# Patient Record
Sex: Male | Born: 1970 | Race: White | Hispanic: No | Smoking: Current every day smoker
Health system: Southern US, Community
[De-identification: ages and names within clinical notes are randomized; demographics above are authoritative.]

## PROBLEM LIST (undated history)

## (undated) DIAGNOSIS — N200 Calculus of kidney: Secondary | ICD-10-CM

## (undated) DIAGNOSIS — F1910 Other psychoactive substance abuse, uncomplicated: Secondary | ICD-10-CM

## (undated) HISTORY — PX: WRIST SURGERY: SHX841

---

## 2006-02-09 ENCOUNTER — Emergency Department: Payer: Self-pay | Admitting: Emergency Medicine

## 2007-09-15 ENCOUNTER — Emergency Department: Payer: Self-pay | Admitting: Emergency Medicine

## 2007-12-14 ENCOUNTER — Emergency Department: Payer: Self-pay | Admitting: Emergency Medicine

## 2008-03-09 ENCOUNTER — Emergency Department: Payer: Self-pay | Admitting: Emergency Medicine

## 2010-11-23 ENCOUNTER — Emergency Department: Payer: Self-pay | Admitting: Emergency Medicine

## 2011-02-25 ENCOUNTER — Emergency Department: Payer: Self-pay | Admitting: Emergency Medicine

## 2011-02-26 ENCOUNTER — Emergency Department: Payer: Self-pay | Admitting: Emergency Medicine

## 2011-04-07 ENCOUNTER — Emergency Department: Payer: Self-pay | Admitting: Internal Medicine

## 2011-07-04 ENCOUNTER — Emergency Department (HOSPITAL_COMMUNITY)
Admission: EM | Admit: 2011-07-04 | Discharge: 2011-07-04 | Disposition: A | Discharge status: Home / Self Care | Payer: Self-pay | Attending: Emergency Medicine | Admitting: Emergency Medicine

## 2011-07-04 ENCOUNTER — Encounter: Payer: Self-pay | Admitting: *Deleted

## 2011-07-04 DIAGNOSIS — F17200 Nicotine dependence, unspecified, uncomplicated: Secondary | ICD-10-CM | POA: Insufficient documentation

## 2011-07-04 DIAGNOSIS — F1120 Opioid dependence, uncomplicated: Secondary | ICD-10-CM | POA: Insufficient documentation

## 2011-07-04 LAB — CBC
MCV: 89.6 fL (ref 78.0–100.0)
Platelets: 280 10*3/uL (ref 150–400)
RBC: 4.83 MIL/uL (ref 4.22–5.81)
RDW: 13.8 % (ref 11.5–15.5)
WBC: 9 10*3/uL (ref 4.0–10.5)

## 2011-07-04 LAB — RAPID URINE DRUG SCREEN, HOSP PERFORMED
Amphetamines: NOT DETECTED
Barbiturates: POSITIVE — AB
Benzodiazepines: NOT DETECTED
Tetrahydrocannabinol: POSITIVE — AB

## 2011-07-04 LAB — URINE MICROSCOPIC-ADD ON

## 2011-07-04 LAB — URINALYSIS, ROUTINE W REFLEX MICROSCOPIC
Bilirubin Urine: NEGATIVE
Glucose, UA: NEGATIVE mg/dL
Ketones, ur: NEGATIVE mg/dL
Protein, ur: NEGATIVE mg/dL
pH: 7 (ref 5.0–8.0)

## 2011-07-04 LAB — COMPREHENSIVE METABOLIC PANEL
ALT: 231 U/L — ABNORMAL HIGH (ref 0–53)
AST: 67 U/L — ABNORMAL HIGH (ref 0–37)
Albumin: 3.7 g/dL (ref 3.5–5.2)
CO2: 26 mEq/L (ref 19–32)
Chloride: 101 mEq/L (ref 96–112)
Creatinine, Ser: 0.86 mg/dL (ref 0.50–1.35)
GFR calc non Af Amer: >90 mL/min (ref >90)
Potassium: 3.9 mEq/L (ref 3.5–5.1)
Sodium: 138 mEq/L (ref 135–145)
Total Bilirubin: 0.3 mg/dL (ref 0.3–1.2)

## 2011-07-04 MED ORDER — ZOLPIDEM TARTRATE 5 MG PO TABS: 5.0000 mg | ORAL_TABLET | Freq: Every evening | ORAL | Status: DC | PRN

## 2011-07-04 MED ORDER — ONDANSETRON HCL 4 MG PO TABS: 4.0000 mg | ORAL_TABLET | Freq: Four times a day (QID) | ORAL | Status: AC

## 2011-07-04 MED ORDER — CLONIDINE HCL 0.2 MG PO TABS: 0.1000 mg | ORAL_TABLET | Freq: Three times a day (TID) | ORAL | Status: DC | PRN

## 2011-07-04 NOTE — ED Notes (Signed)
Patient is alert and oriented x 3.  Denies any SI/HI

## 2011-07-04 NOTE — ED Provider Notes (Signed)
History     CSN: [id number] Arrival date & time: 07/04/2011  5:46 PM   First MD Initiated Contact with Patient 07/04/11 1837      Chief Complaint  Patient presents with  . Addiction Problem    (Consider location/radiation/quality/duration/timing/severity/associated sxs/prior treatment) The history is provided by the patient.   requests detox from her periods and heroin.  He reports using for the last 10 years.  Denies suicidal and homicidal ideation.  His last use was this morning.  He does not abuse other drugs.  Nothing worsens the symptoms.  Nothing improves his symptoms.  He was hoping for something to help him as he goes through withdrawals.  History reviewed. No pertinent past medical history.  History reviewed. No pertinent past surgical history.  History reviewed. No pertinent family history.  History  Substance Use Topics  . Smoking status: Current Everyday Smoker -- 1.0 packs/day    Types: Cigarettes  . Smokeless tobacco: Not on file  . Alcohol Use: Yes      Review of Systems  All other systems reviewed and are negative.    Allergies  Review of patient's allergies indicates no known allergies.  Home Medications   Current Outpatient Rx  Name Route Sig Dispense Refill  . CLONIDINE HCL 0.2 MG PO TABS Oral Take 0.5 tablets (0.1 mg total) by mouth 3 (three) times daily as needed. 15 tablet 0  . ONDANSETRON HCL 4 MG PO TABS Oral Take 1 tablet (4 mg total) by mouth every 6 (six) hours. 12 tablet 0  . ZOLPIDEM TARTRATE 5 MG PO TABS Oral Take 1 tablet (5 mg total) by mouth at bedtime as needed for sleep. 7 tablet 0    BP 113/63  Pulse 73  Temp(Src) 98.6 F (37 C) (Oral)  Resp 16  SpO2 99%  Physical Exam  Constitutional: He is oriented to person, place, and time. He appears well-developed and well-nourished.  HENT:  Head: Normocephalic.  Eyes: EOM are normal.  Neck: Normal range of motion.  Pulmonary/Chest: Effort normal.  Musculoskeletal: Normal range  of motion.  Neurological: He is alert and oriented to person, place, and time.  Psychiatric: He has a normal mood and affect.    ED Course  Procedures (including critical care time)   Labs Reviewed  URINALYSIS, ROUTINE W REFLEX MICROSCOPIC  URINE RAPID DRUG SCREEN (HOSP PERFORMED)  CBC  COMPREHENSIVE METABOLIC PANEL  ETHANOL   No results found.   1. Narcotic addiction       MDM  Outpatient narcotic withdrawal medicines given.  Instructed to return to ER for new or worsening symptoms including severe nausea vomiting and inability to keep fluids down.  No indication for involuntary commitment or inpatient treatment.  Patient is agreeable to outpatient plan       Lyanne Co, MD 07/04/11 [phone]

## 2011-07-04 NOTE — ED Notes (Signed)
Patient is requesting detox from opiods and heroin. Patient has been using for the past 10 years but the last few years it has been worse due to his wife passing away.  Patient last use was this am with heroin

## 2011-07-21 ENCOUNTER — Emergency Department: Payer: Self-pay | Admitting: *Deleted

## 2011-07-28 ENCOUNTER — Emergency Department: Payer: Self-pay | Admitting: Emergency Medicine

## 2014-07-04 ENCOUNTER — Emergency Department: Payer: Self-pay | Admitting: Emergency Medicine

## 2014-08-26 ENCOUNTER — Emergency Department: Payer: Self-pay | Admitting: Emergency Medicine

## 2014-12-04 ENCOUNTER — Observation Stay
Admit: 2014-12-04 | Disposition: A | Discharge status: Home / Self Care | Payer: Self-pay | Attending: Internal Medicine | Admitting: Internal Medicine

## 2014-12-04 LAB — BASIC METABOLIC PANEL
ANION GAP: 3 — AB (ref 7–16)
BUN: 15 mg/dL
CHLORIDE: 105 mmol/L
Calcium, Total: 9.1 mg/dL
Co2: 25 mmol/L
Creatinine: 0.73 mg/dL
EGFR (Non-African Amer.): >60
GLUCOSE: 98 mg/dL
POTASSIUM: 4.1 mmol/L
SODIUM: 133 mmol/L — AB

## 2014-12-04 LAB — HEPATIC FUNCTION PANEL A (ARMC)
ALBUMIN: 4.2 g/dL
Alkaline Phosphatase: 64 U/L
BILIRUBIN DIRECT: 0.1 mg/dL
BILIRUBIN INDIRECT: 0.6
BILIRUBIN TOTAL: 0.7 mg/dL
SGOT(AST): 31 U/L
SGPT (ALT): 54 U/L
TOTAL PROTEIN: 7.7 g/dL

## 2014-12-04 LAB — CBC
HCT: 43.7 % (ref 40.0–52.0)
HGB: 14.6 g/dL (ref 13.0–18.0)
MCH: 29.2 pg (ref 26.0–34.0)
MCHC: 33.3 g/dL (ref 32.0–36.0)
MCV: 88 fL (ref 80–100)
PLATELETS: 282 10*3/uL (ref 150–440)
RBC: 4.99 10*6/uL (ref 4.40–5.90)
RDW: 14 % (ref 11.5–14.5)
WBC: 9.6 10*3/uL (ref 3.8–10.6)

## 2014-12-04 LAB — TROPONIN I
TROPONIN-I: 0.06 ng/mL — AB
TROPONIN-I: 0.04 ng/mL — AB

## 2014-12-04 LAB — CK TOTAL AND CKMB (NOT AT ARMC)
CK, TOTAL: 190 U/L
CK-MB: 3.1 ng/mL

## 2014-12-05 LAB — DRUG SCREEN, URINE
AMPHETAMINES, UR SCREEN: NEGATIVE
BENZODIAZEPINE, UR SCRN: POSITIVE
Barbiturates, Ur Screen: NEGATIVE
Cannabinoid 50 Ng, Ur ~~LOC~~: NEGATIVE
Cocaine Metabolite,Ur ~~LOC~~: POSITIVE
MDMA (ECSTASY) UR SCREEN: NEGATIVE
METHADONE, UR SCREEN: NEGATIVE
OPIATE, UR SCREEN: POSITIVE
Phencyclidine (PCP) Ur S: NEGATIVE
TRICYCLIC, UR SCREEN: NEGATIVE

## 2014-12-05 LAB — BASIC METABOLIC PANEL
Anion Gap: 2 — ABNORMAL LOW (ref 7–16)
BUN: 14 mg/dL
CHLORIDE: 107 mmol/L
Calcium, Total: 8.5 mg/dL — ABNORMAL LOW
Co2: 25 mmol/L
Creatinine: 0.78 mg/dL
EGFR (African American): >60
EGFR (Non-African Amer.): >60
Glucose: 87 mg/dL
POTASSIUM: 4.8 mmol/L
SODIUM: 134 mmol/L — AB

## 2014-12-05 LAB — TROPONIN I: Troponin-I: 0.06 ng/mL — ABNORMAL HIGH

## 2014-12-05 LAB — CBC WITH DIFFERENTIAL/PLATELET
BASOS ABS: 0.1 10*3/uL (ref 0.0–0.1)
Basophil %: 0.7 %
EOS ABS: 0.2 10*3/uL (ref 0.0–0.7)
Eosinophil %: 3 %
HCT: 44.3 % (ref 40.0–52.0)
HGB: 15.1 g/dL (ref 13.0–18.0)
LYMPHS ABS: 1.8 10*3/uL (ref 1.0–3.6)
Lymphocyte %: 24.4 %
MCH: 29.9 pg (ref 26.0–34.0)
MCHC: 34 g/dL (ref 32.0–36.0)
MCV: 88 fL (ref 80–100)
MONOS PCT: 10.8 %
Monocyte #: 0.8 x10 3/mm (ref 0.2–1.0)
Neutrophil #: 4.6 10*3/uL (ref 1.4–6.5)
Neutrophil %: 61.1 %
PLATELETS: 262 10*3/uL (ref 150–440)
RBC: 5.04 10*6/uL (ref 4.40–5.90)
RDW: 14.5 % (ref 11.5–14.5)
WBC: 7.5 10*3/uL (ref 3.8–10.6)

## 2014-12-05 LAB — LIPID PANEL
Cholesterol: 168 mg/dL
HDL Cholesterol: 32 mg/dL — ABNORMAL LOW
Ldl Cholesterol, Calc: 126 mg/dL — ABNORMAL HIGH
Triglycerides: 49 mg/dL
VLDL Cholesterol, Calc: 10 mg/dL

## 2014-12-05 LAB — TSH: THYROID STIMULATING HORM: 0.464 u[IU]/mL

## 2014-12-05 LAB — CK TOTAL AND CKMB (NOT AT ARMC)
CK, TOTAL: 188 U/L
CK-MB: 2.8 ng/mL

## 2014-12-05 LAB — HEMOGLOBIN A1C
HEMOGLOBIN A1C: 5.2 %
Hemoglobin A1C: 5.2 %

## 2014-12-06 DIAGNOSIS — R079 Chest pain, unspecified: Secondary | ICD-10-CM | POA: Diagnosis not present
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
## 2014-12-16 NOTE — Consult Note (Signed)
Psychiatry: PAtient seen. He is having significant discomfort tonight from opiate withdrawl. Feeling shaky and having GI symptoms. Mood mildly down and anxious. give one time dose of subutex 16mg  tonight to help with withdrawl. Supportive encouragement. No other change to current treatment.   Electronic Signatures: Clapacs, Jackquline DenmarkJohn T (MD)  (Signed on 20-Apr-16 23:31)  Authored  Last Updated: 20-Apr-16 23:31 by Audery Amellapacs, John T (MD)
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
## 2014-12-16 NOTE — Discharge Summary (Addendum)
PATIENT NAME:  Barry Wheeler, Barry Wheeler MR#:  [id number] DATE OF BIRTH:  October 16, 1970  DATE OF ADMISSION:  12/04/2014 DATE OF DISCHARGE:  12/06/2014  PRIMARY DOCTOR:   Nonlocal.   DISCHARGE DIAGNOSES: 1. Chest pain secondary to anxiety.  2. History of heroin addiction.   3. Narcotic abuse.  4. Heroin withdrawal symptoms.   DISCHARGE MEDICATIONS: Atorvastatin 20 mg p.o. at bedtime.   DIET:  Low fat diet.   CONSULTATIONS:  Psychiatric consult with Mordecai RasmussenJohn Clapacs, MD   HOSPITAL COURSE: A [age] year old male patient with significant history of heroin addiction comes in because of chest pain. He also had nausea and diaphoresis when he came.  Chest pain is of 2 days' duration.  No aggravating or relieving factors.    1. The patient admitted to chest pain rule out and and started on aspirin, nitroglycerin, and statins. The patient's troponins were negative x3 with normal CK and CPK-MB.  The patient had a Lexiscan stress test done on April 21 which showed EF of 55% with left ventricular function.  No EKG changes concerning for ischemia and no wall motion abnormalities and  EF more than 55%  The patient's LDL more than 100, we discharged him with statins.  2. Heroin addiction.  The patient is addicted to heroin about 1 to 2 times per day IV and it is going on for about 5 years.  The patient was seen by Dr. Toni Amendlapacs and we asked for a consult for possible opiate withdrawal syndrome.   The patient's last use was around 1:00 a.m. on the day of admission. He was given Suboxone before this admission previously, but Dr. Toni Amendlapacs suggested Suboxone should be given 8 mg every 8 hours as needed for withdrawal symptoms. The patient also was given p.r.n. suboxone..  We asked  for  social work evaluation about substance abuse treatment facilities.   The patient received Suboxone for opiate withdrawal symptoms of nausea,  vomiting, and sweating and discharged home,  and he is advised to followup with substance abuse treatment at  [facility]   We gave information about [facility],   DISCHARGE VITAL SIGNS: Temperature 98.9, heart rate 54, blood pressure 120/80 saturation 96% on room air.   PHYSICAL EXAMINATION:  alert.oriented. LUNGS: Clear to auscultation. No wheeze.  No rales.  ABDOMEN: Soft, nontender, nondistended, no rebound.  EXTREmities;no edema,no cyanosis,no clubbing  TIME SPENT: More than 50 minutes.    ____________________________ Katha HammingSnehalatha Resa Rinks, MD sk:tr D: 12/08/2014 06:54:55 ET T: 12/08/2014 12:14:14 ET JOB#: [id number]  cc: Katha HammingSnehalatha Kimbery Harwood, MD, <Dictator> Katha HammingSNEHALATHA Cambry Spampinato MD ELECTRONICALLY SIGNED 12/18/2014 21:23

## 2014-12-18 ENCOUNTER — Encounter: Payer: Self-pay | Admitting: Cardiovascular Disease

## 2015-02-03 ENCOUNTER — Emergency Department
Admission: EM | Admit: 2015-02-03 | Discharge: 2015-02-04 | Disposition: A | Discharge status: Home / Self Care | Payer: Self-pay | Attending: Emergency Medicine | Admitting: Emergency Medicine

## 2015-02-03 ENCOUNTER — Encounter: Payer: Self-pay | Admitting: Emergency Medicine

## 2015-02-03 DIAGNOSIS — Z720 Tobacco use: Secondary | ICD-10-CM | POA: Insufficient documentation

## 2015-02-03 DIAGNOSIS — K1120 Sialoadenitis, unspecified: Secondary | ICD-10-CM | POA: Insufficient documentation

## 2015-02-03 MED ORDER — OXYCODONE-ACETAMINOPHEN 5-325 MG PO TABS
1.0000 | ORAL_TABLET | Freq: Once | ORAL | Status: AC
  Administered 2015-02-04: 1 via ORAL

## 2015-02-03 NOTE — ED Notes (Addendum)
Pt presents to ER alert and in NAD. pt has swelling noted to left side of jaw, denies toothache.

## 2015-02-03 NOTE — ED Provider Notes (Signed)
[facility] Emergency Department Provider Note  ____________________________________________  Time seen: Approximately 11:20 PM  I have reviewed the triage vital signs and the nursing notes.   HISTORY  Chief Complaint Jaw Pain    HPI Barry Wheeler is a 44 y.o. male who presents with 2 days of left-sided jaw swelling. He reports that initially it was tender but the swelling seemed to really increased today. The patient reports that he has tried ice and heat as well as ibuprofen and the pain has not improved. The patient reports that he's never had this before. He reports it is a sharp pain in his left face it is a 9 out of 10 in intensity. He reports he also has some pain when he moves his neck back and forth. The patient also denies any trauma to the area. He's had no difficulty swallowing, no fevers no headache or blurry vision.   History reviewed. No pertinent past medical history.  There are no active problems to display for this patient.   Past Surgical History  Procedure Laterality Date  . Wrist surgery      Current Outpatient Rx  Name  Route  Sig  Dispense  Refill  . clindamycin (CLEOCIN) 300 MG capsule   Oral   Take 1 capsule (300 mg total) by mouth 3 (three) times daily.   30 capsule   0   . EXPIRED: cloNIDine (CATAPRES) 0.2 MG tablet   Oral   Take 0.5 tablets (0.1 mg total) by mouth 3 (three) times daily as needed.   15 tablet   0   . traMADol (ULTRAM) 50 MG tablet   Oral   Take 1 tablet (50 mg total) by mouth every 6 (six) hours as needed.   12 tablet   0   . EXPIRED: zolpidem (AMBIEN) 5 MG tablet   Oral   Take 1 tablet (5 mg total) by mouth at bedtime as needed for sleep.   7 tablet   0     Allergies Review of patient's allergies indicates no known allergies.  History reviewed. No pertinent family history.  Social History History  Substance Use Topics  . Smoking status: Current Every Day Smoker -- 1.00 packs/day   Types: Cigarettes  . Smokeless tobacco: Not on file  . Alcohol Use: Yes    Review of Systems Constitutional: No fever/chills Eyes: No visual changes. ENT: Left facial swelling Cardiovascular: Denies chest pain. Respiratory: Denies shortness of breath. Gastrointestinal: No abdominal pain.  No nausea, no vomiting.   Genitourinary: Negative for dysuria. Musculoskeletal: Negative for back pain. Skin: Negative for rash. Neurological: Negative for headaches  10-point ROS otherwise negative.  ____________________________________________   PHYSICAL EXAM:  VITAL SIGNS: ED Triage Vitals  Enc Vitals Group     BP 02/03/15 2108 130/87 mmHg     Pulse Rate 02/03/15 2108 86     Resp 02/03/15 2108 20     Temp 02/03/15 2108 98.4 F (36.9 C)     Temp Source 02/03/15 2108 Oral     SpO2 02/03/15 2108 100 %     Weight 02/03/15 2108 155 lb (70.308 kg)     Height 02/03/15 2108 5\' 6"  (1.676 m)     Head Cir --      Peak Flow --      Pain Score 02/03/15 2108 9     Pain Loc --      Pain Edu? --      Excl. in GC? --  Constitutional: Alert and oriented. Well appearing and in moderate distress. Eyes: Conjunctivae are normal. PERRL. EOMI. Head: Atraumatic. Swelling and tenderness to palpation that is firm along the left angle of the mandible going down into the patient's neck Nose: No congestion/rhinnorhea. Mouth/Throat: Mucous membranes are moist.  Oropharynx non-erythematous. Neck: No stridor.  Hematological/Lymphatic/Immunilogical: No cervical lymphadenopathy. Cardiovascular: Normal rate, regular rhythm. Grossly normal heart sounds.  Good peripheral circulation. Respiratory: Normal respiratory effort.  No retractions. Lungs CTAB. Gastrointestinal: Soft and nontender. No distention. Positive bowel sounds Genitourinary: Deferred Musculoskeletal: No lower extremity tenderness nor edema.   Neurologic:  Normal speech and language. No gross focal neurologic deficits are appreciated.  Skin:   Skin is warm, dry and intact.  Psychiatric: Mood and affect are normal.   ____________________________________________   LABS (all labs ordered are listed, but only abnormal results are displayed)  Labs Reviewed  BASIC METABOLIC PANEL - Abnormal; Notable for the following:    Glucose, Bld 100 (*)    Calcium 8.5 (*)    Anion gap 4 (*)    All other components within normal limits  CBC WITH DIFFERENTIAL/PLATELET - Abnormal; Notable for the following:    WBC 11.6 (*)    Neutro Abs 7.6 (*)    Monocytes Absolute 1.4 (*)    All other components within normal limits   ____________________________________________  EKG  None ____________________________________________  RADIOLOGY  CT soft tissue neck: Subtle hyper enhancement within the superficial lobe of the left parotid gland with associated mild inflammatory stranding within the adjacent subcutaneous fat, most consistent with acute parotitis ____________________________________________   PROCEDURES  Procedure(s) performed: None  Critical Care performed: No  ____________________________________________   INITIAL IMPRESSION / ASSESSMENT AND PLAN / ED COURSE  Pertinent labs & imaging results that were available during my care of the patient were reviewed by me and considered in my medical decision making (see chart for details).  This is a 44 year old male who comes in today with left-sided swelling of his face. I will give the patient a dose of Percocet and do a CT of his jaw and neck looking for the cause of this mass whether it is an enlarged lymph node or a tumor in one of his glands. The patient will receive some blood work as well  ----------------------------------------- 3:38 AM on 02/04/2015 -----------------------------------------  The patient did not have any constitutional symptoms with his parotitis nor does he have any drainage from his parotid duct. I will give the patient a dose of clindamycin IV 1 and  then the patient home with clindamycin to treat his parotitis. Patient currently appears well. He has mild leukocytosis. I will have the patient follow-up with ENT for further evaluation and advise him to return should he develop any high fevers any difficulty swallowing or worsening symptoms. ____________________________________________   FINAL CLINICAL IMPRESSION(S) / ED DIAGNOSES  Final diagnoses:  Parotitis      Rebecka Apley, MD 02/04/15 [phone]

## 2015-02-03 NOTE — ED Notes (Signed)
Report given to Myrtha Mantis, RN

## 2015-02-04 ENCOUNTER — Emergency Department: Payer: PRIVATE HEALTH INSURANCE

## 2015-02-04 ENCOUNTER — Encounter: Payer: Self-pay | Admitting: Radiology

## 2015-02-04 LAB — CBC WITH DIFFERENTIAL/PLATELET
BASOS ABS: 0.1 10*3/uL (ref 0–0.1)
Basophils Relative: 1 %
EOS PCT: 2 %
Eosinophils Absolute: 0.2 10*3/uL (ref 0–0.7)
HCT: 42.6 % (ref 40.0–52.0)
Hemoglobin: 14.4 g/dL (ref 13.0–18.0)
LYMPHS PCT: 21 %
Lymphs Abs: 2.4 10*3/uL (ref 1.0–3.6)
MCH: 29.8 pg (ref 26.0–34.0)
MCHC: 33.9 g/dL (ref 32.0–36.0)
MCV: 88.1 fL (ref 80.0–100.0)
Monocytes Absolute: 1.4 10*3/uL — ABNORMAL HIGH (ref 0.2–1.0)
Monocytes Relative: 12 %
NEUTROS ABS: 7.6 10*3/uL — AB (ref 1.4–6.5)
NEUTROS PCT: 64 %
Platelets: 339 10*3/uL (ref 150–440)
RBC: 4.83 MIL/uL (ref 4.40–5.90)
RDW: 14.2 % (ref 11.5–14.5)
WBC: 11.6 10*3/uL — AB (ref 3.8–10.6)

## 2015-02-04 LAB — BASIC METABOLIC PANEL
Anion gap: 4 — ABNORMAL LOW (ref 5–15)
BUN: 14 mg/dL (ref 6–20)
CALCIUM: 8.5 mg/dL — AB (ref 8.9–10.3)
CHLORIDE: 109 mmol/L (ref 101–111)
CO2: 23 mmol/L (ref 22–32)
CREATININE: 0.86 mg/dL (ref 0.61–1.24)
GFR calc Af Amer: >60 mL/min (ref >60)
GFR calc non Af Amer: >60 mL/min (ref >60)
Glucose, Bld: 100 mg/dL — ABNORMAL HIGH (ref 65–99)
Potassium: 3.5 mmol/L (ref 3.5–5.1)
Sodium: 136 mmol/L (ref 135–145)

## 2015-02-04 MED ORDER — OXYCODONE-ACETAMINOPHEN 5-325 MG PO TABS
ORAL_TABLET | ORAL | Status: AC
  Administered 2015-02-04: 1 via ORAL
  Filled 2015-02-04: qty 1

## 2015-02-04 MED ORDER — CLINDAMYCIN PHOSPHATE 600 MG/50ML IV SOLN
INTRAVENOUS | Status: AC
  Filled 2015-02-04: qty 50

## 2015-02-04 MED ORDER — TRAMADOL HCL 50 MG PO TABS: 50.0000 mg | ORAL_TABLET | Freq: Four times a day (QID) | ORAL | Status: DC | PRN

## 2015-02-04 MED ORDER — CLINDAMYCIN PHOSPHATE 600 MG/50ML IV SOLN
600.0000 mg | Freq: Once | INTRAVENOUS | Status: AC
  Administered 2015-02-04: 600 mg via INTRAVENOUS

## 2015-02-04 MED ORDER — IOHEXOL 300 MG/ML  SOLN
75.0000 mL | Freq: Once | INTRAMUSCULAR | Status: AC | PRN
  Administered 2015-02-04: 75 mL via INTRAVENOUS

## 2015-02-04 MED ORDER — CLINDAMYCIN HCL 300 MG PO CAPS: 300.0000 mg | ORAL_CAPSULE | Freq: Three times a day (TID) | ORAL | Status: DC

## 2015-02-04 NOTE — ED Notes (Signed)
Pt became very angry when he saw his prescription for Tramadol; began cursing; left hastily from treatment room; mother stayed long enough to get prescription info and followup information

## 2015-02-04 NOTE — Discharge Instructions (Signed)
Parotitis °Parotitis is soreness and inflammation of one or both parotid glands. The parotid glands produce saliva. They are located on each side of the face, below and in front of the earlobes. The saliva produced comes out of tiny openings (ducts) inside the cheeks. In most cases, parotitis goes away over time or with treatment. If your parotitis is caused by certain long-term (chronic) diseases, it may come back again.  °CAUSES  °Parotitis can be caused by: °· Viral infections. Mumps is one viral infection that can cause parotitis. °· Bacterial infections. °· Blockage of the salivary ducts due to a salivary stone. °· Narrowing of the salivary ducts. °· Swelling of the salivary ducts. °· Dehydration. °· Autoimmune conditions, such as sarcoidosis or Sjogren syndrome. °· Air from activities such as scuba diving, glass blowing, or playing an instrument (rare). °· Human immunodeficiency virus (HIV) or acquired immunodeficiency syndrome (AIDS). °· Tuberculosis. °SIGNS AND SYMPTOMS  °· The ears may appear to be pushed up and out from their normal position. °· Redness (erythema) of the skin over the parotid glands. °· Pain and tenderness over the parotid glands. °· Swelling in the parotid gland area. °· Yellowish-white fluid (pus) coming from the ducts inside the cheeks. °· Dry mouth. °· Bad taste in the mouth. °DIAGNOSIS  °Your health care provider may determine that you have parotitis based on your symptoms and a physical exam. A sample of fluid may also be taken from the parotid gland and tested to find the cause of your infection. X-rays or computed tomography (CT) scans may be taken if your health care provider thinks you might have a salivary stone blocking your salivary duct. °TREATMENT  °Treatment varies depending upon the cause of your parotitis. If your parotitis is caused by mumps, no treatment is needed. The condition will go away on its own after 7 to 10 days. In other cases, treatment may  include: °· Antibiotic medicine if your infection was caused by bacteria. °· Pain medicines. °· Gland massage. °· Eating sour candy to increase your saliva production. °· Removal of salivary stones. Your health care provider may flush stones out with fluids or remove them with tweezers. °· Surgery to remove the parotid glands. °HOME CARE INSTRUCTIONS  °· If you were prescribed an antibiotic medicine, finish it all even if you start to feel better. °· Put warm compresses on the sore area. °· Take medicines only as directed by your health care provider. °· Drink enough fluids to keep your urine clear or pale yellow. °SEEK IMMEDIATE MEDICAL CARE IF:  °· You have increasing pain or swelling that is not controlled with medicine. °· You have a fever. °MAKE SURE YOU: °· Understand these instructions. °· Will watch your condition. °· Will get help right away if you are not doing well or get worse. °Document Released: 01/23/2002 Document Revised: 12/18/2013 Document Reviewed: 06/29/2011 °[employer]® Patient Information ©2015 [employer]. This information is not intended to replace advice given to you by your health care provider. Make sure you discuss any questions you have with your health care provider. ° °

## 2015-02-05 ENCOUNTER — Emergency Department (HOSPITAL_COMMUNITY)
Admission: EM | Admit: 2015-02-05 | Discharge: 2015-02-05 | Disposition: A | Discharge status: Home / Self Care | Payer: PRIVATE HEALTH INSURANCE | Attending: Emergency Medicine | Admitting: Emergency Medicine

## 2015-02-05 ENCOUNTER — Encounter (HOSPITAL_COMMUNITY): Payer: Self-pay | Admitting: Emergency Medicine

## 2015-02-05 DIAGNOSIS — R6883 Chills (without fever): Secondary | ICD-10-CM | POA: Insufficient documentation

## 2015-02-05 DIAGNOSIS — Z720 Tobacco use: Secondary | ICD-10-CM | POA: Insufficient documentation

## 2015-02-05 DIAGNOSIS — K1121 Acute sialoadenitis: Secondary | ICD-10-CM | POA: Insufficient documentation

## 2015-02-05 DIAGNOSIS — Z792 Long term (current) use of antibiotics: Secondary | ICD-10-CM | POA: Insufficient documentation

## 2015-02-05 MED ORDER — HYDROCODONE-ACETAMINOPHEN 5-325 MG PO TABS: 1.0000 | ORAL_TABLET | ORAL | Status: DC | PRN

## 2015-02-05 MED ORDER — IBUPROFEN 800 MG PO TABS: 800.0000 mg | ORAL_TABLET | Freq: Three times a day (TID) | ORAL | Status: DC | PRN

## 2015-02-05 MED ORDER — HYDROCODONE-ACETAMINOPHEN 5-325 MG PO TABS
1.0000 | ORAL_TABLET | Freq: Once | ORAL | Status: AC
  Administered 2015-02-05: 1 via ORAL
  Filled 2015-02-05: qty 1

## 2015-02-05 NOTE — ED Notes (Signed)
Pt seen 2 days ago at [facility] for same with swelling from tear duct to left side of face; pt sts taking pain meds but not helping

## 2015-02-05 NOTE — ED Provider Notes (Signed)
CSN: [id number]     Arrival date & time 02/05/15  1715 History  This chart was scribed for Trixie Dredge, PA-C working with Gerhard Munch, MD by Evon Slack, ED Scribe. This patient was seen in room TR04C/TR04C and the patient's care was started at 6:08 PM.    Chief Complaint  Patient presents with  . Facial Pain   The history is provided by the patient. No language interpreter was used.   HPI Comments: Barry Wheeler is a 44 y.o. male who presents to the Emergency Department complaining of worsening left sided facial pain and swelling that began 5 days ago. Pt was seen yesterday at [facility] and had CT of jaw and neck that showed findings consistent with acute parotitis. He was discharged home on clindamycin and tramadol. Pt states that he is compliant with taking the medications prescribed. Pt state that the tramadol prescribed is not providing any relief. Pt states that initially the area was tender and he noticed swelling a few days later. Pt states that he has a follow up appointment with ENT on Monday. Pt does report intermittent chills. He denies fever, sore throat, trouble swallowing, dental pain, SOB, CP, nausea or vomiting.  Denies any significant change in his symptoms, he came to the ED today for uncontrolled pain.     History reviewed. No pertinent past medical history. Past Surgical History  Procedure Laterality Date  . Wrist surgery     History reviewed. No pertinent family history. History  Substance Use Topics  . Smoking status: Current Every Day Smoker -- 1.00 packs/day    Types: Cigarettes  . Smokeless tobacco: Not on file  . Alcohol Use: Yes    Review of Systems  Constitutional: Positive for chills. Negative for fever.  HENT: Positive for facial swelling. Negative for dental problem, sore throat and trouble swallowing.   Respiratory: Negative for shortness of breath.   Cardiovascular: Negative for chest pain.  Gastrointestinal: Negative for  nausea and vomiting.  Musculoskeletal: Negative for neck pain and neck stiffness.  Skin: Negative for rash.  Allergic/Immunologic: Negative for immunocompromised state.  Psychiatric/Behavioral: Negative for self-injury.    Allergies  Review of patient's allergies indicates no known allergies.  Home Medications   Prior to Admission medications   Medication Sig Start Date End Date Taking? Authorizing Provider  clindamycin (CLEOCIN) 300 MG capsule Take 1 capsule (300 mg total) by mouth 3 (three) times daily. 02/04/15   Rebecka Apley, MD  cloNIDine (CATAPRES) 0.2 MG tablet Take 0.5 tablets (0.1 mg total) by mouth 3 (three) times daily as needed. 07/04/11 07/03/12  Azalia Bilis, MD  traMADol (ULTRAM) 50 MG tablet Take 1 tablet (50 mg total) by mouth every 6 (six) hours as needed. 02/04/15   Rebecka Apley, MD  zolpidem (AMBIEN) 5 MG tablet Take 1 tablet (5 mg total) by mouth at bedtime as needed for sleep. 07/04/11 07/03/12  Azalia Bilis, MD   BP 115/71 mmHg  Pulse 82  Temp(Src) 97.6 F (36.4 C) (Oral)  Resp 16  Ht  (1.676 m)  Wt 148 lb 14.4 oz (67.541 kg)  BMI 24.04 kg/m2  SpO2 98%   Physical Exam  Constitutional: He appears well-developed and well-nourished. No distress.  HENT:  Head: Normocephalic and atraumatic.  Mouth/Throat: Oropharynx is clear and moist.  Firm tender swelling at angle of mandible on left with no erythema, warmth, edema, fluctuance. Severe dental decay of bilateral lower molars without apparent abscess- no erythema, edema, discharge  associated.  Neck: Neck supple.  Cardiovascular: Normal rate and normal heart sounds.   No murmur heard. Pulmonary/Chest: Effort normal. No respiratory distress.  Neurological: He is alert.  Skin: He is not diaphoretic.  Nursing note and vitals reviewed.   ED Course  Procedures (including critical care time) DIAGNOSTIC STUDIES: Oxygen Saturation is 98% on RA, normal by my interpretation.    COORDINATION OF  CARE: 6:26 PM-Discussed treatment plan with pt at bedside and pt agreed to plan.     Labs Review Labs Reviewed - No data to display  Imaging Review Ct Soft Tissue Neck W Contrast  02/04/2015   CLINICAL DATA:  Initial evaluation for 2 day history of left-sided jaw swelling, pain.  EXAM: CT NECK WITH CONTRAST  TECHNIQUE: Multidetector CT imaging of the neck was performed using the standard protocol following the bolus administration of intravenous contrast.  CONTRAST:  75mL OMNIPAQUE IOHEXOL 300 MG/ML  SOLN  COMPARISON:  None available.  FINDINGS: Visualized portions of the brain are unremarkable. The partially visualized globes and orbits within normal limits. Remote posttraumatic deformity noted at the left lamina papyracea. Mild polypoid opacity present within the inferior right maxillary sinus. The visualized paranasal sinuses are otherwise clear. Right concha bullosa noted. Visualized mastoid air cells are clear.  The right parotid gland and submandibular glands are normal in appearance.  There is subtle hyper attenuation of the superficial lobe of the left parotid gland in the region of the left parotid tail (series 2, image 32). There is subtle inflammatory stranding within the adjacent subcutaneous fat. Finding is most consistent with a focal parotitis. This appears to correspond with the palpable area of concern as evidenced by overlying metallic marker BB in this region. No abscess or drainable fluid collection. No obstructive stone identified.  Oral cavity within normal limits. Palatine tonsils are normal. Parapharyngeal fat preserved. Oropharynx and nasopharynx within normal limits. No retropharyngeal fluid collection. Epiglottis is normal. Vallecula is clear. Hypopharynx and supraglottic larynx demonstrate no acute abnormality. True vocal cords are symmetric and normal in appearance. Subglottic airway is clear.  No pathologically enlarged lymph nodes identified within the neck.  Thyroid gland is  normal.  Visualized superior mediastinum within normal limits.  Mild paraseptal emphysematous changes noted within the visualized right lung. Visualized lungs are otherwise clear.  Normal intravascular enhancement seen within the neck. Mild atheromatous plaque present about the carotid bifurcations without significant stenosis.  No acute osseous abnormality. No worrisome lytic or blastic osseous lesion.  IMPRESSION: 1. Subtle hyper enhancement within the superficial lobe of the left parotid gland with associated mild inflammatory stranding within the adjacent subcutaneous fat. Finding is most consistent with acute parotitis. This appears to correlate with the palpable area of concern. 2. Otherwise normal neck CT.   Electronically Signed   By: Rise Mu M.D.   On: 02/04/2015 02:31     EKG Interpretation None      MDM   Final diagnoses:  Acute parotitis    Afebrile, nontoxic patient seen in ED yesterday found to have acute parotitis, d/c home with clindamycin and tramadol not having pain relief.  No change in symptoms.  No airway concerns or fever.   D/C home with norco, ibuprofen.  Encouraged ENT follow up as planned.  Discussed result, findings, treatment, and follow up  with patient.  Pt given return precautions.  Pt verbalizes understanding and agrees with plan.       I personally performed the services described in this documentation, which was  scribed in my presence. The recorded information has been reviewed and is accurate.      Trixie Dredge, PA-C 02/05/15 2018  Gerhard Munch, MD 02/06/15 [phone]

## 2015-02-05 NOTE — Discharge Instructions (Signed)
Read the information below.  Use the prescribed medication as directed.  Please discuss all new medications with your pharmacist.  Do not take additional tylenol while taking the prescribed pain medication to avoid overdose.  You may return to the Emergency Department at any time for worsening condition or any new symptoms that concern you.   If you develop high fevers, difficulty swallowing or breathing, or you are unable to tolerate fluids by mouth, return to the ER immediately for a recheck.     Parotitis  Parotitis means one or both of your parotid glands are sore and puffy (inflamed). The parotid glands make spit (saliva) in the mouth. HOME CARE  If you were given antibiotic medicines, take them as told. Finish them even if you start to feel better.  Put warm cloths (compresses) on the sore area.  Only take medicines as told by your doctor.  Drink enough fluids to keep your pee (urine) clear or pale yellow. GET HELP RIGHT AWAY IF:  You have more pain or puffiness (swelling) that is not helped by medicine.  You have a fever. MAKE SURE YOU:  Understand these instructions.  Will watch your condition.  Will get help right away if you are not doing well or get worse. Document Released: 09/05/2010 Document Revised: 10/26/2011 Document Reviewed: 06/29/2011 [facility] Patient Information 2015 [place], [place]. This information is not intended to replace advice given to you by your health care provider. Make sure you discuss any questions you have with your health care provider.    Parotitis Parotitis is soreness and inflammation of one or both parotid glands. The parotid glands produce saliva. They are located on each side of the face, below and in front of the earlobes. The saliva produced comes out of tiny openings (ducts) inside the cheeks. In most cases, parotitis goes away over time or with treatment. If your parotitis is caused by certain long-term (chronic) diseases, it may come back  again.  CAUSES  Parotitis can be caused by:  Viral infections. Mumps is one viral infection that can cause parotitis.  Bacterial infections.  Blockage of the salivary ducts due to a salivary stone.  Narrowing of the salivary ducts.  Swelling of the salivary ducts.  Dehydration.  Autoimmune conditions, such as sarcoidosis or Sjogren syndrome.  Air from activities such as scuba diving, glass blowing, or playing an instrument (rare).  Human immunodeficiency virus (HIV) or acquired immunodeficiency syndrome (AIDS).  Tuberculosis. SIGNS AND SYMPTOMS   The ears may appear to be pushed up and out from their normal position.  Redness (erythema) of the skin over the parotid glands.  Pain and tenderness over the parotid glands.  Swelling in the parotid gland area.  Yellowish-white fluid (pus) coming from the ducts inside the cheeks.  Dry mouth.  Bad taste in the mouth. DIAGNOSIS  Your health care provider may determine that you have parotitis based on your symptoms and a physical exam. A sample of fluid may also be taken from the parotid gland and tested to find the cause of your infection. X-rays or computed tomography (CT) scans may be taken if your health care provider thinks you might have a salivary stone blocking your salivary duct. TREATMENT  Treatment varies depending upon the cause of your parotitis. If your parotitis is caused by mumps, no treatment is needed. The condition will go away on its own after 7 to 10 days. In other cases, treatment may include:  Antibiotic medicine if your infection was caused by  bacteria.  Pain medicines.  Gland massage.  Eating sour candy to increase your saliva production.  Removal of salivary stones. Your health care provider may flush stones out with fluids or remove them with tweezers.  Surgery to remove the parotid glands. HOME CARE INSTRUCTIONS   If you were prescribed an antibiotic medicine, finish it all even if you start  to feel better.  Put warm compresses on the sore area.  Take medicines only as directed by your health care provider.  Drink enough fluids to keep your urine clear or pale yellow. SEEK IMMEDIATE MEDICAL CARE IF:   You have increasing pain or swelling that is not controlled with medicine.  You have a fever. MAKE SURE YOU:  Understand these instructions.  Will watch your condition.  Will get help right away if you are not doing well or get worse. Document Released: 01/23/2002 Document Revised: 12/18/2013 Document Reviewed: 06/29/2011 [facility] Patient Information 2015 [place], [place]. This information is not intended to replace advice given to you by your health care provider. Make sure you discuss any questions you have with your health care provider.

## 2016-04-27 ENCOUNTER — Emergency Department
Admission: EM | Admit: 2016-04-27 | Discharge: 2016-04-27 | Disposition: A | Discharge status: Home / Self Care | Payer: PRIVATE HEALTH INSURANCE | Attending: Emergency Medicine | Admitting: Emergency Medicine

## 2016-04-27 ENCOUNTER — Emergency Department: Payer: PRIVATE HEALTH INSURANCE

## 2016-04-27 ENCOUNTER — Encounter: Payer: Self-pay | Admitting: Emergency Medicine

## 2016-04-27 DIAGNOSIS — F419 Anxiety disorder, unspecified: Secondary | ICD-10-CM | POA: Insufficient documentation

## 2016-04-27 DIAGNOSIS — F17210 Nicotine dependence, cigarettes, uncomplicated: Secondary | ICD-10-CM | POA: Insufficient documentation

## 2016-04-27 DIAGNOSIS — R1030 Lower abdominal pain, unspecified: Secondary | ICD-10-CM | POA: Insufficient documentation

## 2016-04-27 DIAGNOSIS — R110 Nausea: Secondary | ICD-10-CM | POA: Insufficient documentation

## 2016-04-27 LAB — URINALYSIS COMPLETE WITH MICROSCOPIC (ARMC ONLY)
BILIRUBIN URINE: NEGATIVE
Bacteria, UA: NONE SEEN
GLUCOSE, UA: NEGATIVE mg/dL
Ketones, ur: NEGATIVE mg/dL
Leukocytes, UA: NEGATIVE
Nitrite: NEGATIVE
Protein, ur: NEGATIVE mg/dL
Specific Gravity, Urine: 1.016 (ref 1.005–1.030)
pH: 5 (ref 5.0–8.0)

## 2016-04-27 LAB — LIPASE, BLOOD: Lipase: 25 U/L (ref 11–51)

## 2016-04-27 LAB — COMPREHENSIVE METABOLIC PANEL
ALBUMIN: 4.1 g/dL (ref 3.5–5.0)
ALT: 70 U/L — AB (ref 17–63)
AST: 39 U/L (ref 15–41)
Alkaline Phosphatase: 52 U/L (ref 38–126)
Anion gap: 5 (ref 5–15)
BILIRUBIN TOTAL: 0.6 mg/dL (ref 0.3–1.2)
BUN: 13 mg/dL (ref 6–20)
CHLORIDE: 103 mmol/L (ref 101–111)
CO2: 28 mmol/L (ref 22–32)
CREATININE: 0.81 mg/dL (ref 0.61–1.24)
Calcium: 9 mg/dL (ref 8.9–10.3)
GFR calc Af Amer: >60 mL/min (ref >60)
GFR calc non Af Amer: >60 mL/min (ref >60)
GLUCOSE: 99 mg/dL (ref 65–99)
POTASSIUM: 3.7 mmol/L (ref 3.5–5.1)
Sodium: 136 mmol/L (ref 135–145)
TOTAL PROTEIN: 7.5 g/dL (ref 6.5–8.1)

## 2016-04-27 LAB — CBC
HCT: 48.1 % (ref 40.0–52.0)
Hemoglobin: 16.7 g/dL (ref 13.0–18.0)
MCH: 31.4 pg (ref 26.0–34.0)
MCHC: 34.7 g/dL (ref 32.0–36.0)
MCV: 90.3 fL (ref 80.0–100.0)
PLATELETS: 222 10*3/uL (ref 150–440)
RBC: 5.32 MIL/uL (ref 4.40–5.90)
RDW: 14.5 % (ref 11.5–14.5)
WBC: 9.6 10*3/uL (ref 3.8–10.6)

## 2016-04-27 MED ORDER — CYCLOBENZAPRINE HCL 10 MG PO TABS: 10.0000 mg | ORAL_TABLET | Freq: Three times a day (TID) | ORAL | 0 refills | Status: DC | PRN

## 2016-04-27 NOTE — ED Provider Notes (Signed)
[facility] Emergency Department Provider Note        Time seen: ----------------------------------------- 9:07 AM on 04/27/2016 -----------------------------------------    I have reviewed the triage vital signs and the nursing notes.   HISTORY  Chief Complaint Abdominal Pain    HPI Barry Wheeler is a 45 y.o. male who presents to ER with abdominal pain for last several months and has been intermittent. He describes occasional nausea and difficulty making urine but denies vomiting or diarrhea. Patient denies fevers or chills, denies chest pain or shortness of breath. Patient states when he gets stressed or anxious and seems to make his symptoms worse.   History reviewed. No pertinent past medical history.  There are no active problems to display for this patient.   Past Surgical History:  Procedure Laterality Date  . WRIST SURGERY      Allergies Review of patient's allergies indicates no known allergies.  Social History Social History  Substance Use Topics  . Smoking status: Current Every Day Smoker    Packs/day: 1.00    Types: Cigarettes  . Smokeless tobacco: Not on file  . Alcohol use No    Review of Systems Constitutional: Negative for fever. Cardiovascular: Negative for chest pain. Respiratory: Negative for shortness of breath. Gastrointestinal: Positive for abdominal pain, nausea Genitourinary: Negative for dysuria. Musculoskeletal: Negative for back pain. Skin: Negative for rash. Neurological: Negative for headaches, focal weakness or numbness. Psychiatric: Positive for anxiety  10-point ROS otherwise negative.  ____________________________________________   PHYSICAL EXAM:  VITAL SIGNS: ED Triage Vitals  Enc Vitals Group     BP 04/27/16 0721 (!) 145/84     Pulse Rate 04/27/16 0721 95     Resp 04/27/16 0721 18     Temp 04/27/16 0721 98.1 F (36.7 C)     Temp Source 04/27/16 0721 Oral     SpO2 04/27/16 0721 96 %     Weight 04/27/16 0721 170 lb (77.1 kg)     Height 04/27/16 0721 5\' 7"  (1.702 m)     Head Circumference --      Peak Flow --      Pain Score 04/27/16 0830 7     Pain Loc --      Pain Edu? --      Excl. in GC? --     Constitutional: Alert and oriented. Well appearing and in no distress. Eyes: Conjunctivae are normal. PERRL. Normal extraocular movements. ENT   Head: Normocephalic and atraumatic.   Nose: No congestion/rhinnorhea.   Mouth/Throat: Mucous membranes are moist.   Neck: No stridor. Cardiovascular: Normal rate, regular rhythm. No murmurs, rubs, or gallops. Respiratory: Normal respiratory effort without tachypnea nor retractions. Breath sounds are clear and equal bilaterally. No wheezes/rales/rhonchi. Gastrointestinal: Soft and nontender. Normal bowel sounds Musculoskeletal: Nontender with normal range of motion in all extremities. No lower extremity tenderness nor edema. Neurologic:  Normal speech and language. No gross focal neurologic deficits are appreciated.  Skin:  Skin is warm, dry and intact. No rash noted. Psychiatric: Mood and affect are normal. Speech and behavior are normal.  ____________________________________________  ED COURSE:  Pertinent labs & imaging results that were available during my care of the patient were reviewed by me and considered in my medical decision making (see chart for details). Clinical Course  Patient presents to ER with nonspecific abdominal pain, we'll assess with basic labs and imaging. Possible renal colic  Procedures ____________________________________________   LABS (pertinent positives/negatives)  Labs Reviewed  COMPREHENSIVE METABOLIC PANEL -  Abnormal; Notable for the following:       Result Value   ALT 70 (*)    All other components within normal limits  URINALYSIS COMPLETEWITH MICROSCOPIC ([facility] ONLY) - Abnormal; Notable for the following:    Color, Urine YELLOW (*)    APPearance CLEAR (*)    Hgb urine  dipstick 1+ (*)    Squamous Epithelial / LPF 0-5 (*)    All other components within normal limits  LIPASE, BLOOD  CBC    RADIOLOGY Images were viewed by me  CT renal protocol IMPRESSION: No acute findings in the abdomen/pelvis.  Stable bilateral L5 spondylolysis.  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  Aortic atherosclerosis.  ____________________________________________  FINAL ASSESSMENT AND PLAN  Abdominal pain  Plan: Patient with labs and imaging as dictated above. No clear etiology for his abdominal pain. CT imaging and labs are unremarkable. I will discharge him with Flexeril and he is stable to follow up with his primary care doctor.   Emily FilbertWilliams, Jonathan E, MD   Note: This dictation was prepared with Dragon dictation. Any transcriptional errors that result from this process are unintentional    Emily FilbertJonathan E Williams, MD 04/27/16 1032

## 2016-04-27 NOTE — ED Triage Notes (Signed)
R lower abdominal pain x "several months". Occasional nausea but denies vomiting or diarrhea.

## 2016-05-24 ENCOUNTER — Encounter: Payer: Self-pay | Admitting: Emergency Medicine

## 2016-05-24 ENCOUNTER — Emergency Department
Admission: EM | Admit: 2016-05-24 | Discharge: 2016-05-24 | Disposition: A | Discharge status: Home / Self Care | Payer: PRIVATE HEALTH INSURANCE | Attending: Emergency Medicine | Admitting: Emergency Medicine

## 2016-05-24 DIAGNOSIS — N410 Acute prostatitis: Secondary | ICD-10-CM

## 2016-05-24 DIAGNOSIS — F17210 Nicotine dependence, cigarettes, uncomplicated: Secondary | ICD-10-CM | POA: Insufficient documentation

## 2016-05-24 LAB — URINALYSIS COMPLETE WITH MICROSCOPIC (ARMC ONLY)
Bacteria, UA: NONE SEEN
Bilirubin Urine: NEGATIVE
Glucose, UA: NEGATIVE mg/dL
KETONES UR: NEGATIVE mg/dL
Nitrite: NEGATIVE
PROTEIN: 100 mg/dL — AB
Specific Gravity, Urine: 1.025 (ref 1.005–1.030)
pH: 6 (ref 5.0–8.0)

## 2016-05-24 MED ORDER — SULFAMETHOXAZOLE-TRIMETHOPRIM 800-160 MG PO TABS: 1.0000 | ORAL_TABLET | Freq: Two times a day (BID) | ORAL | 0 refills | Status: DC

## 2016-05-24 MED ORDER — TAMSULOSIN HCL 0.4 MG PO CAPS: 0.4000 mg | ORAL_CAPSULE | Freq: Every day | ORAL | 0 refills | Status: DC

## 2016-05-24 NOTE — ED Triage Notes (Signed)
Pt presents to ED c/o difficulty urinating x1 month. States he will feel sudden urge to pee but only pees small amounts at a time and sensation of incomplete emptying. States sporadic burning with urination.

## 2016-05-24 NOTE — ED Notes (Signed)
Pt states he did not urinate at all yesterday and when he feels the need to urinate he only gets a small amount.

## 2016-05-24 NOTE — ED Provider Notes (Signed)
[facility] Emergency Department Provider Note  ____________________________________________  Time seen: Approximately 3:41 PM  I have reviewed the triage vital signs and the nursing notes.   HISTORY  Chief Complaint No chief complaint on file.    HPI Barry Wheeler is a 45 y.o. male who presents emergency department complaining of intermittent dysuria, perineal/lower back pain intermittently times a month. Patient states that the symptoms have been intermittent but have been increasing in intensity and frequency over the past month. Patient states that it is a tight or even burning sensation to the perineal region when trying to urinate. Patient states that he has urgency all only urinate small amounts at a time. Patient states that he occasionally has to strain to urinate. Patient still is urinating note. Patient has not had any significant weight loss, fevers or chills, abdominal pain, nausea or vomiting and last month.Patient has had kidney stones in the past but states that pain is different from kidney stones. He denies any flank pain. He denies any gross hematuria. He denies any penile discharge.   History reviewed. No pertinent past medical history.  There are no active problems to display for this patient.   Past Surgical History:  Procedure Laterality Date  . WRIST SURGERY      Prior to Admission medications   Medication Sig Start Date End Date Taking? Authorizing Provider  cyclobenzaprine (FLEXERIL) 10 MG tablet Take 1 tablet (10 mg total) by mouth 3 (three) times daily as needed for muscle spasms. 04/27/16   Emily Filbert, MD  sulfamethoxazole-trimethoprim (BACTRIM DS,SEPTRA DS) 800-160 MG tablet Take 1 tablet by mouth 2 (two) times daily. 05/24/16   Delorise Royals Jaquane Boughner, PA-C  tamsulosin (FLOMAX) 0.4 MG CAPS capsule Take 1 capsule (0.4 mg total) by mouth daily. 05/24/16   Delorise Royals Joretta Eads, PA-C    Allergies Review of patient's allergies  indicates no known allergies.  History reviewed. No pertinent family history.  Social History Social History  Substance Use Topics  . Smoking status: Current Every Day Smoker    Packs/day: 1.00    Types: Cigarettes  . Smokeless tobacco: Never Used  . Alcohol use No     Review of Systems  Constitutional: No fever/chills Cardiovascular: no chest pain. Respiratory: no cough. No SOB. Gastrointestinal: No abdominal pain.  No nausea, no vomiting.  No diarrhea.  No constipation. Genitourinary: As above for dysuria. Positive for straining to void. No hematuria Musculoskeletal: Negative for musculoskeletal pain. Skin: Negative for rash, abrasions, lacerations, ecchymosis. Neurological: Negative for headaches, focal weakness or numbness. 10-point ROS otherwise negative.  ____________________________________________   PHYSICAL EXAM:  VITAL SIGNS: ED Triage Vitals  Enc Vitals Group     BP 05/24/16 1332 (!) 156/94     Pulse Rate 05/24/16 1332 71     Resp 05/24/16 1332 18     Temp 05/24/16 1332 98.2 F (36.8 C)     Temp Source 05/24/16 1332 Oral     SpO2 05/24/16 1332 98 %     Weight 05/24/16 1333 170 lb (77.1 kg)     Height 05/24/16 1333 5\' 7"  (1.702 m)     Head Circumference --      Peak Flow --      Pain Score 05/24/16 1334 0     Pain Loc --      Pain Edu? --      Excl. in GC? --      Constitutional: Alert and oriented. Well appearing and in no acute  distress. Eyes: Conjunctivae are normal. PERRL. EOMI. Head: Atraumatic. Cardiovascular: Normal rate, regular rhythm. Normal S1 and S2.  Good peripheral circulation. Respiratory: Normal respiratory effort without tachypnea or retractions. Lungs CTAB. Good air entry to the bases with no decreased or absent breath sounds. Gastrointestinal: Bowel sounds 4 quadrants. Soft and nontender to palpation. No guarding or rigidity. No palpable masses. No distention. No CVA tenderness.Digital rectal exam reveals firm, warm prostate to  palpation. No palpable nodules. Prostate is very tender to palpation. Palpation of the prostate re-creates pain. Musculoskeletal: Full range of motion to all extremities. No gross deformities appreciated. Neurologic:  Normal speech and language. No gross focal neurologic deficits are appreciated.  Skin:  Skin is warm, dry and intact. No rash noted. Psychiatric: Mood and affect are normal. Speech and behavior are normal. Patient exhibits appropriate insight and judgement.   ____________________________________________   LABS (all labs ordered are listed, but only abnormal results are displayed)  Labs Reviewed  URINALYSIS COMPLETEWITH MICROSCOPIC ([facility] ONLY) - Abnormal; Notable for the following:       Result Value   Color, Urine AMBER (*)    APPearance HAZY (*)    Hgb urine dipstick 3+ (*)    Protein, ur 100 (*)    Leukocytes, UA TRACE (*)    Squamous Epithelial / LPF 0-5 (*)    All other components within normal limits   ____________________________________________  EKG   ____________________________________________  RADIOLOGY   No results found.  ____________________________________________    PROCEDURES  Procedure(s) performed:    Procedures    Medications - No data to display   ____________________________________________   INITIAL IMPRESSION / ASSESSMENT AND PLAN / ED COURSE  Pertinent labs & imaging results that were available during my care of the patient were reviewed by me and considered in my medical decision making (see chart for details).  Review of the [place] CSRS was performed in accordance of the [facility] prior to dispensing any controlled drugs.  Clinical Course    Patient's diagnosis is consistent with Acute bacterial prostatitis. Patient's symptoms and physical exam are consistent with the above diagnosis. Patient's urinalysis is mostly reassuring with results being consistent with previous recorded urinalysis. Patient denies any flank pain or  gross hematuria. The patient does have a history of kidney stones in the past, patient states that symptoms are completely different than previous incident. As such, no CT to evaluate for renal stone is ordered at this time. Patient's prostate is enlarged, and painful on digital rectal exam.. Patient will be discharged home with prescriptions for antibiotics and Flomax. Patient is to follow up with urology. Patient is given ED precautions to return to the ED for any worsening or new symptoms.     ____________________________________________  FINAL CLINICAL IMPRESSION(S) / ED DIAGNOSES  Final diagnoses:  Acute prostatitis      NEW MEDICATIONS STARTED DURING THIS VISIT:  New Prescriptions   SULFAMETHOXAZOLE-TRIMETHOPRIM (BACTRIM DS,SEPTRA DS) 800-160 MG TABLET    Take 1 tablet by mouth 2 (two) times daily.   TAMSULOSIN (FLOMAX) 0.4 MG CAPS CAPSULE    Take 1 capsule (0.4 mg total) by mouth daily.        This chart was dictated using voice recognition software/Dragon. Despite best efforts to proofread, errors can occur which can change the meaning. Any change was purely unintentional.    Racheal PatchesJonathan D Riyanshi Wahab, PA-C 05/24/16 1547    Emily FilbertJonathan E Williams, MD 05/24/16 [phone]

## 2016-05-28 ENCOUNTER — Encounter: Payer: Self-pay | Admitting: Urology

## 2016-05-28 ENCOUNTER — Ambulatory Visit: Payer: Self-pay | Admitting: Urology

## 2016-06-21 ENCOUNTER — Encounter (HOSPITAL_COMMUNITY): Payer: Self-pay | Admitting: *Deleted

## 2016-06-21 ENCOUNTER — Emergency Department (HOSPITAL_COMMUNITY)
Admission: EM | Admit: 2016-06-21 | Discharge: 2016-06-21 | Disposition: A | Discharge status: Home / Self Care | Attending: Emergency Medicine | Admitting: Emergency Medicine

## 2016-06-21 DIAGNOSIS — Z5321 Procedure and treatment not carried out due to patient leaving prior to being seen by health care provider: Secondary | ICD-10-CM | POA: Insufficient documentation

## 2016-06-21 DIAGNOSIS — R109 Unspecified abdominal pain: Secondary | ICD-10-CM | POA: Insufficient documentation

## 2016-06-21 DIAGNOSIS — F17210 Nicotine dependence, cigarettes, uncomplicated: Secondary | ICD-10-CM | POA: Insufficient documentation

## 2016-06-21 HISTORY — DX: Calculus of kidney: N20.0

## 2016-06-21 NOTE — ED Notes (Signed)
Pt called twice with no answer.

## 2016-06-21 NOTE — ED Triage Notes (Signed)
[facility] started on Flomax for Prostate problems, since pt has been having trouble urinating, nausea and pain in flank area

## 2016-08-12 ENCOUNTER — Encounter: Payer: Self-pay | Admitting: Urology

## 2016-08-12 ENCOUNTER — Ambulatory Visit: Payer: PRIVATE HEALTH INSURANCE | Admitting: Urology

## 2017-01-09 ENCOUNTER — Encounter: Payer: Self-pay | Admitting: Emergency Medicine

## 2017-01-09 ENCOUNTER — Emergency Department
Admission: EM | Admit: 2017-01-09 | Discharge: 2017-01-09 | Disposition: A | Discharge status: Home / Self Care | Payer: PRIVATE HEALTH INSURANCE | Attending: Emergency Medicine | Admitting: Emergency Medicine

## 2017-01-09 DIAGNOSIS — J320 Chronic maxillary sinusitis: Secondary | ICD-10-CM

## 2017-01-09 DIAGNOSIS — F17210 Nicotine dependence, cigarettes, uncomplicated: Secondary | ICD-10-CM

## 2017-01-09 DIAGNOSIS — J0100 Acute maxillary sinusitis, unspecified: Secondary | ICD-10-CM | POA: Insufficient documentation

## 2017-01-09 MED ORDER — SULFAMETHOXAZOLE-TRIMETHOPRIM 800-160 MG PO TABS: 1.0000 | ORAL_TABLET | Freq: Two times a day (BID) | ORAL | 0 refills | Status: DC

## 2017-01-09 MED ORDER — PREDNISONE 10 MG PO TABS: ORAL_TABLET | ORAL | 0 refills | Status: DC

## 2017-01-09 NOTE — ED Notes (Signed)
See triage note . States he developed sinus pressure and congestion about 1 week  States he tried OTC meds for sinus  Denies any relief  Feels like sx's are getting worse

## 2017-01-09 NOTE — ED Provider Notes (Signed)
[facility] Emergency Department Provider Note ____________________________________________  Time seen: 9:19 AM  I have reviewed the triage vital signs and the nursing notes.  HISTORY  Chief Complaint  Nasal Congestion   HPI Barry Wheeler is a 46 y.o. male history complaint of nasal and head congestion for over a week. Patient states that pressure around his eyes and facial area have increased. He also has moderate pain in both his ears. He has been using over-the-counter products such as Sudafed and Flonase nasal spray without any relief. Patient states he has been unable to breathe from his nose for over a week. Patient is a smoker and smokes one half pack cigarettes per day. He denies any known fever.  Past Medical History:  Diagnosis Date  . Kidney stones     There are no active problems to display for this patient.   Past Surgical History:  Procedure Laterality Date  . WRIST SURGERY      Prior to Admission medications   Medication Sig Start Date End Date Taking? Authorizing Provider  pseudoephedrine (SUDAFED) 60 MG tablet Take 60 mg by mouth every 4 (four) hours as needed for congestion.   Yes [provider]  predniSONE (DELTASONE) 10 MG tablet Take 6 tablets  today, on day 2 take 5 tablets, day 3 take 4 tablets, day 4 take 3 tablets, day 5 take  2 tablets and 1 tablet the last day 01/09/17   Tommi RumpsSummers, Zyen Triggs L, PA-C  sulfamethoxazole-trimethoprim (BACTRIM DS,SEPTRA DS) 800-160 MG tablet Take 1 tablet by mouth 2 (two) times daily. 01/09/17   Tommi RumpsSummers, Maclean Foister L, PA-C    Allergies Patient has no known allergies.  No family history on file.  Social History Social History  Substance Use Topics  . Smoking status: Current Every Day Smoker    Packs/day: 1.00    Types: Cigarettes  . Smokeless tobacco: Never Used  . Alcohol use No    Review of Systems  Constitutional: Negative for fever. Eyes: Negative for visual changes. ENT: Positive  for nasal congestion. Positive for ear pain. Positive for facial pain. Cardiovascular: Negative for chest pain. Respiratory: Negative for shortness of breath. Gastrointestinal: Negative for abdominal pain, vomiting  Musculoskeletal: Negative for back pain. Skin: Negative for rash. Neurological: Positive for headaches ____________________________________________  PHYSICAL EXAM:  VITAL SIGNS: ED Triage Vitals [01/09/17 0828]  Enc Vitals Group     BP (!) 142/86     Pulse Rate (!) 105     Resp 20     Temp 98 F (36.7 C)     Temp Source Oral     SpO2 97 %     Weight 190 lb (86.2 kg)     Height 5\' 8"  (1.727 m)     Head Circumference      Peak Flow      Pain Score 3     Pain Loc      Pain Edu?      Excl. in GC?     Constitutional: Alert and oriented. Well appearing and in no distress. Head: Normocephalic and atraumatic. Eyes: Conjunctivae are normal.  Ears: Canals clear. TMs intact bilaterally.TMs are dull bilaterally. Nose: Moderate nasal congestion with right nares is red and swollen. Moderate right maxillary sinus tenderness to percussion. Mouth/Throat: Mucous membranes are moist. Mild posterior drainage noted. Neck: Supple.  Hematological/Lymphatic/Immunological: No cervical lymphadenopathy. Cardiovascular: Normal rate, regular rhythm. Normal distal pulses. Respiratory: Normal respiratory effort. No wheezes/rales/rhonchi. Musculoskeletal: Moves upper and lower extremities without any  difficulty. Normal gait noted. Neurologic:  Normal gait without ataxia. Normal speech and language. No gross focal neurologic deficits are appreciated. Skin:  Skin is warm, dry and intact. No rash noted. Psychiatric: Mood and affect are normal. Patient exhibits appropriate insight and judgment.    INITIAL IMPRESSION / ASSESSMENT AND PLAN / ED COURSE  Patient is continue using Flonase and Sudafed. He is given a prescription for prednisone 60 mg 6 day taper along with Bactrim DS twice a day  for 10 days. Patient is encouraged to discontinue smoking or at least decrease the amount. Increase fluids. He may also use saline nose spray to help with medical illness. Also ibuprofen or Tylenol if needed for pain.    ____________________________________________  FINAL CLINICAL IMPRESSION(S) / ED DIAGNOSES  Final diagnoses:  Right maxillary sinusitis  Smokes cigarettes     Tommi Rumps, PA-C 01/09/17 0944    Arnaldo Natal, MD 01/09/17 1438

## 2017-01-09 NOTE — ED Triage Notes (Signed)
Patient to ER for c/o nasal/head congestion for over one week. Patient states he feels pressure in his head and has mild pain to both ears from sinus pressure/congestion.

## 2017-01-09 NOTE — Discharge Instructions (Signed)
Begin taking antibiotics and prednisone today as directed. Increase fluids. Tylenol or ibuprofen if needed for facial pain. Discontinue smoking. Continue Sudafed and Flonase nasal spray. You may also use saline nose spray frequently to help remove mucus from your nose.

## 2017-03-22 ENCOUNTER — Emergency Department
Admission: EM | Admit: 2017-03-22 | Discharge: 2017-03-22 | Discharge status: Against Medical Advice | Payer: PRIVATE HEALTH INSURANCE | Attending: Emergency Medicine | Admitting: Emergency Medicine

## 2017-03-22 ENCOUNTER — Emergency Department: Payer: PRIVATE HEALTH INSURANCE

## 2017-03-22 DIAGNOSIS — F17210 Nicotine dependence, cigarettes, uncomplicated: Secondary | ICD-10-CM | POA: Insufficient documentation

## 2017-03-22 DIAGNOSIS — R0789 Other chest pain: Secondary | ICD-10-CM

## 2017-03-22 DIAGNOSIS — Z5320 Procedure and treatment not carried out because of patient's decision for unspecified reasons: Secondary | ICD-10-CM | POA: Insufficient documentation

## 2017-03-22 LAB — BASIC METABOLIC PANEL
ANION GAP: 8 (ref 5–15)
BUN: 15 mg/dL (ref 6–20)
CALCIUM: 9.6 mg/dL (ref 8.9–10.3)
CHLORIDE: 105 mmol/L (ref 101–111)
CO2: 25 mmol/L (ref 22–32)
CREATININE: 0.65 mg/dL (ref 0.61–1.24)
Glucose, Bld: 105 mg/dL — ABNORMAL HIGH (ref 65–99)
POTASSIUM: 3.8 mmol/L (ref 3.5–5.1)
SODIUM: 138 mmol/L (ref 135–145)

## 2017-03-22 LAB — TSH: TSH: 1.53 u[IU]/mL (ref 0.350–4.500)

## 2017-03-22 LAB — CBC
HCT: 45.5 % (ref 40.0–52.0)
Hemoglobin: 15.4 g/dL (ref 13.0–18.0)
MCH: 30.5 pg (ref 26.0–34.0)
MCHC: 33.9 g/dL (ref 32.0–36.0)
MCV: 90.1 fL (ref 80.0–100.0)
PLATELETS: 296 10*3/uL (ref 150–440)
RBC: 5.05 MIL/uL (ref 4.40–5.90)
RDW: 13.3 % (ref 11.5–14.5)
WBC: 9.2 10*3/uL (ref 3.8–10.6)

## 2017-03-22 LAB — MAGNESIUM: MAGNESIUM: 1.8 mg/dL (ref 1.7–2.4)

## 2017-03-22 LAB — TROPONIN I: Troponin I: 0.03 ng/mL (ref <0.03)

## 2017-03-22 NOTE — ED Triage Notes (Signed)
Pt presents to ED via POV with c/o palpitations and central CP x3-4 days. Pt reports "my heart was fluttering quite regular with occasional sharp pains".  Pt reports (+) SHOB and (-) nausea. Pt reports "I'm under house arrest and haven't been as active. I'm a smoker so I normally have shortness of breath". Pt reports similar h/x of same in the past. Was seen here a yr or so ago for same, kept for OBS d/t elevated cardiac enzymes. Pt is A&), in NAD; RR even, regular and unlabored.

## 2017-03-22 NOTE — ED Notes (Signed)
Pt denies any pain at present, SR noted on the monitor. VSS.Marland Kitchen.will continue to monitor the pt.

## 2017-03-22 NOTE — ED Provider Notes (Addendum)
[facility] Emergency Department Provider Note  ____________________________________________   I have reviewed the triage vital signs and the nursing notes.   HISTORY  Chief Complaint Chest Pain    HPI Barry Wheeler is a 46 y.o. male who states over the last couple days of intermittent palpitations almost he denies chest pain, he states thathe has been somewhat anxious recently. He denies nausea vomiting and he is not short of breath. He has had palpitations in the past after "drinking" but he no longer drinks to excess. He denies any fever or chills. He denies chest pain with this he states is "uncomfortable, I can tell when is beating fast". He denies any radiation of the discomfort he denies any pleuritic chest pain or leg swelling or personal or family history of PE or DVT. He states that his episodes of palpitations are lasting for a few hours when they come. He is no longer having them at this time. He has no complaints.     Past Medical History:  Diagnosis Date  . Kidney stones     There are no active problems to display for this patient.   Past Surgical History:  Procedure Laterality Date  . WRIST SURGERY      Prior to Admission medications   Medication Sig Start Date End Date Taking? Authorizing Provider  predniSONE (DELTASONE) 10 MG tablet Take 6 tablets  today, on day 2 take 5 tablets, day 3 take 4 tablets, day 4 take 3 tablets, day 5 take  2 tablets and 1 tablet the last day Patient not taking: Reported on 03/22/2017 01/09/17   Tommi Rumps, PA-C  sulfamethoxazole-trimethoprim (BACTRIM DS,SEPTRA DS) 800-160 MG tablet Take 1 tablet by mouth 2 (two) times daily. Patient not taking: Reported on 03/22/2017 01/09/17   Tommi Rumps, PA-C    Allergies Patient has no known allergies.  No family history on file.  Social History Social History  Substance Use Topics  . Smoking status: Current Every Day Smoker    Packs/day: 1.00   Types: Cigarettes  . Smokeless tobacco: Never Used  . Alcohol use No    Review of Systems Constitutional: No fever/chills Eyes: No visual changes. ENT: No sore throat. No stiff neck no neck pain Cardiovascular: Denies chest pain.Positive uncomfortable feeling of palpitations Respiratory: Denies shortness of breath. Gastrointestinal:   no vomiting.  No diarrhea.  No constipation. Genitourinary: Negative for dysuria. Musculoskeletal: Negative lower extremity swelling Skin: Negative for rash. Neurological: Negative for severe headaches, focal weakness or numbness.   ____________________________________________   PHYSICAL EXAM:  VITAL SIGNS: ED Triage Vitals  Enc Vitals Group     BP 03/22/17 1300 129/89     Pulse Rate 03/22/17 1300 78     Resp 03/22/17 1300 14     Temp --      Temp src --      SpO2 03/22/17 1300 95 %     Weight 03/22/17 1232 168 lb (76.2 kg)     Height 03/22/17 1232 5\' 7"  (1.702 m)     Head Circumference --      Peak Flow --      Pain Score 03/22/17 1232 0     Pain Loc --      Pain Edu? --      Excl. in GC? --     Constitutional: Alert and oriented. Well appearing and in no acute distress. Eyes: Conjunctivae are normal Head: Atraumatic HEENT: No congestion/rhinnorhea. Mucous membranes are moist.  Oropharynx non-erythematous Neck:   Nontender with no meningismus, no masses, no stridor Cardiovascular: Normal rate, regular rhythm. Grossly normal heart sounds.  Good peripheral circulation. Respiratory: Normal respiratory effort.  No retractions. Lungs CTAB. Abdominal: Soft and nontender. No distention. No guarding no rebound Back:  There is no focal tenderness or step off.  there is no midline tenderness there are no lesions noted. there is no CVA tenderness Musculoskeletal: No lower extremity tenderness, no upper extremity tenderness. No joint effusions, no DVT signs strong distal pulses no edema, home arrest ankle bracelet noted Neurologic:  Normal  speech and language. No gross focal neurologic deficits are appreciated.  Skin:  Skin is warm, dry and intact. No rash noted. Psychiatric: Mood and affect are normal. Speech and behavior are normal.  ____________________________________________   LABS (all labs ordered are listed, but only abnormal results are displayed)  Labs Reviewed  BASIC METABOLIC PANEL - Abnormal; Notable for the following:       Result Value   Glucose, Bld 105 (*)    All other components within normal limits  TROPONIN I - Abnormal; Notable for the following:    Troponin I 0.03 (*)    All other components within normal limits  CBC  MAGNESIUM  TSH  TROPONIN I   ____________________________________________  EKG  I personally interpreted any EKGs ordered by me or triage Sinus rhythm rate 97 bpm, normal axis no acute ST elevation or depression ____________________________________________  RADIOLOGY  I reviewed any imaging ordered by me or triage that were performed during my shift and, if possible, patient and/or family made aware of any abnormal findings. ____________________________________________   PROCEDURES  Procedure(s) performed: None  Procedures  Critical Care performed: None  ____________________________________________   INITIAL IMPRESSION / ASSESSMENT AND PLAN / ED COURSE  Pertinent labs & imaging results that were available during my care of the patient were reviewed by me and considered in my medical decision making (see chart for details).  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 Initial workup is reassuring, troponin of 0.03 as well as in the normal range for this new assay. I have offered the patient repeat troponin 2 ensure that that is not positive but he refuses. He understands the risk of refusal.  Magnesium and TSH are in progress. EKG is reassuring chest x-ray is reassuring vital signs are reassuring. Given that patient is refusing further workup here, we'll discharge him on an AGAINST MEDICAL ADVICE basis with close outpatient follow-up advised for cardiology.   ----------------------------------------- 4:07 PM on 03/22/2017 -----------------------------------------  Did arrange an apartment for the patient tomorrow at 9:00 in the morning at Dr. Santo HeldKahn's office, Dr. Park BreedKahn agrees with management and discharge.    ____________________________________________   FINAL CLINICAL IMPRESSION(S) / ED DIAGNOSES  Final diagnoses:  None      This chart was dictated using voice recognition software.  Despite best efforts to proofread,  errors can occur which can change meaning.      Jeanmarie PlantMcShane, James A, MD 03/22/17 1536    Jeanmarie PlantMcShane, James A, MD 03/22/17 [phone]

## 2017-03-22 NOTE — ED Notes (Signed)
Pt c/o heart fluttering over the past 3 days, denies any discomfort today. Denies any cardiac hx states he had elevated cardiac enzymes in the past and had a stress test a 1-2 years ago that was negative. Pt is in NAD on arrival. Sitting on the stretcher reading a news paper.

## 2017-03-22 NOTE — ED Notes (Signed)
Pt refused to have troponin redrawn even after explaining that his first was slightly elevated. EDP notified.. Pt states he will sigh out AMA if needed but he does not want anymore blood drawn.

## 2017-03-22 NOTE — Discharge Instructions (Signed)
You have refused a second cardiac enzyme, as discussed this limits our ability to evaluate your  heart. It certainly your choice but does limit our ability to make sure that youre safe. Please return to the emergency room therefore if you change your mind about blood draws or you have any new or worrisome symptoms including chest pain, shortness of breath, weakness, lightheadedness or you feel worse in any way.

## 2017-03-22 NOTE — ED Notes (Signed)
Continue to wait for discharge instructions.

## 2017-05-25 ENCOUNTER — Emergency Department: Payer: Self-pay

## 2017-05-25 ENCOUNTER — Encounter: Payer: Self-pay | Admitting: Medical Oncology

## 2017-05-25 ENCOUNTER — Emergency Department
Admission: EM | Admit: 2017-05-25 | Discharge: 2017-05-25 | Disposition: A | Discharge status: Home / Self Care | Payer: Self-pay | Attending: Emergency Medicine | Admitting: Emergency Medicine

## 2017-05-25 DIAGNOSIS — F17210 Nicotine dependence, cigarettes, uncomplicated: Secondary | ICD-10-CM | POA: Insufficient documentation

## 2017-05-25 DIAGNOSIS — J209 Acute bronchitis, unspecified: Secondary | ICD-10-CM | POA: Insufficient documentation

## 2017-05-25 DIAGNOSIS — F17200 Nicotine dependence, unspecified, uncomplicated: Secondary | ICD-10-CM

## 2017-05-25 MED ORDER — BENZONATATE 100 MG PO CAPS: 200.0000 mg | ORAL_CAPSULE | Freq: Three times a day (TID) | ORAL | 0 refills | Status: DC | PRN

## 2017-05-25 MED ORDER — IPRATROPIUM-ALBUTEROL 0.5-2.5 (3) MG/3ML IN SOLN
3.0000 mL | Freq: Once | RESPIRATORY_TRACT | Status: AC
  Administered 2017-05-25: 3 mL via RESPIRATORY_TRACT
  Filled 2017-05-25: qty 3

## 2017-05-25 MED ORDER — PREDNISONE 10 MG PO TABS: ORAL_TABLET | ORAL | 0 refills | Status: DC

## 2017-05-25 MED ORDER — SULFAMETHOXAZOLE-TRIMETHOPRIM 800-160 MG PO TABS: 1.0000 | ORAL_TABLET | Freq: Two times a day (BID) | ORAL | 0 refills | Status: DC

## 2017-05-25 NOTE — ED Triage Notes (Signed)
Pt reports cold sx's that began Sunday.

## 2017-05-25 NOTE — ED Provider Notes (Signed)
[facility] Emergency Department Provider Note  ____________________________________________   First MD Initiated Contact with Patient 05/25/17 1049     (approximate)  I have reviewed the triage vital signs and the nursing notes.   HISTORY  Chief Complaint Nasal Congestion; Cough; and Chills   HPI Barry Wheeler is a 46 y.o. male is here with complaint of cold symptoms that began 2 days ago. Patient states that he has continued to cough and has had a low-grade temp. He has continued to smoke one half to one pack of cigarettes per day. Patient denies any previous history of asthma, bronchitis or pneumonia. Patient rates his pain as an 8 out of 10.   Past Medical History:  Diagnosis Date  . Kidney stones     There are no active problems to display for this patient.   Past Surgical History:  Procedure Laterality Date  . WRIST SURGERY      Prior to Admission medications   Medication Sig Start Date End Date Taking? Authorizing Provider  benzonatate (TESSALON PERLES) 100 MG capsule Take 2 capsules (200 mg total) by mouth 3 (three) times daily as needed. 05/25/17 05/25/18  Tommi Rumps, PA-C  predniSONE (DELTASONE) 10 MG tablet Take 6 tablets  today, on day 2 take 5 tablets, day 3 take 4 tablets, day 4 take 3 tablets, day 5 take  2 tablets and 1 tablet the last day 05/25/17   Tommi Rumps, PA-C  sulfamethoxazole-trimethoprim (BACTRIM DS,SEPTRA DS) 800-160 MG tablet Take 1 tablet by mouth 2 (two) times daily. 05/25/17   Tommi Rumps, PA-C    Allergies Patient has no known allergies.  No family history on file.  Social History Social History  Substance Use Topics  . Smoking status: Current Every Day Smoker    Packs/day: 1.00    Types: Cigarettes  . Smokeless tobacco: Never Used  . Alcohol use No    Review of Systems Constitutional: Subjective fever/chills WUJ:WJXBJYNW nasal congestion. Cardiovascular: Denies chest  pain. Respiratory: Denies shortness of breath.  Positive cough. Gastrointestinal: No abdominal pain.  No nausea, no vomiting.  Musculoskeletal: Negative for back pain. Skin: Negative for rash. Neurological: Positive for headaches, negative for focal weakness or numbness. ____________________________________________   PHYSICAL EXAM:  VITAL SIGNS: ED Triage Vitals  Enc Vitals Group     BP 05/25/17 1013 139/82     Pulse Rate 05/25/17 1013 85     Resp 05/25/17 1013 18     Temp 05/25/17 1013 98.3 F (36.8 C)     Temp Source 05/25/17 1013 Oral     SpO2 05/25/17 1013 96 %     Weight 05/25/17 1014 168 lb (76.2 kg)     Height --      Head Circumference --      Peak Flow --      Pain Score 05/25/17 1013 8     Pain Loc --      Pain Edu? --      Excl. in GC? --     Constitutional: Alert and oriented. Well appearing and in no acute distress. Eyes: Conjunctivae are normal.  Head: Atraumatic. Nose: Moderate congestion/rhinnorhea. Mouth/Throat: Mucous membranes are moist.  Oropharynx non-erythematous. Positive posterior drainage. Neck: No stridor.   Hematological/Lymphatic/Immunilogical: No cervical lymphadenopathy. Cardiovascular: Normal rate, regular rhythm. Grossly normal heart sounds.  Good peripheral circulation. Respiratory: Normal respiratory effort.  No retractions. Lungs bilateral expiratory rales with congested cough. Faint expiratory wheeze heard bilaterally. Patient is able  to talk in complete sentences without any difficulty. Musculoskeletal: Moves upper and lower extremities without any difficulty. Normal gait was noted. Neurologic:  Normal speech and language. No gross focal neurologic deficits are appreciated. No gait instability. Skin:  Skin is warm, dry and intact.  Psychiatric: Mood and affect are normal. Speech and behavior are normal.  ____________________________________________   LABS (all labs ordered are listed, but only abnormal results are displayed)  Labs  Reviewed - No data to display  RADIOLOGY  Dg Chest 2 View  Result Date: 05/25/2017 CLINICAL DATA:  Body aches, fever, congestion and nonproductive cough since 05/23/2017. EXAM: CHEST  2 VIEW COMPARISON:  PA and lateral chest 03/22/2017 and 08/26/2014. FINDINGS: The lungs are clear. Heart size is normal. No pneumothorax pleural effusion. No acute bony abnormality. IMPRESSION: No acute disease. Electronically Signed   By: Drusilla Kanner M.D.   On: 05/25/2017 11:53    ____________________________________________   PROCEDURES  Procedure(s) performed: None  Procedures  Critical Care performed: No  ____________________________________________   INITIAL IMPRESSION / ASSESSMENT AND PLAN / ED COURSE  Patient was given 2 nebulizer treatments while in the emergency department with improvement. Patient was given a prescription for Tessalon Perles every 8 hours as needed for cough, prednisone 60 mg 6 day taper and Bactrim DS twice a day for 10 days. Patient is to increase fluids and take Tylenol or ibuprofen as needed for fever or body aches. Patient was encouraged to discontinue smoking. He is to follow-up with his PCP or [facility]  clinic if any continued problems.    ____________________________________________   FINAL CLINICAL IMPRESSION(S) / ED DIAGNOSES  Final diagnoses:  Acute bronchitis, unspecified organism  Current every day smoker      NEW MEDICATIONS STARTED DURING THIS VISIT:  Discharge Medication List as of 05/25/2017 12:31 PM    START taking these medications   Details  benzonatate (TESSALON PERLES) 100 MG capsule Take 2 capsules (200 mg total) by mouth 3 (three) times daily as needed., Starting Tue 05/25/2017, Until Wed 05/25/2018, Print         Note:  This document was prepared using Dragon voice recognition software and may include unintentional dictation errors.    Tommi Rumps, PA-C 05/25/17 1448    Arnaldo Natal, MD 05/25/17 1452

## 2017-05-25 NOTE — ED Notes (Signed)
Lungs with coarse expiratory sound throughout.   SVN started.

## 2017-05-25 NOTE — ED Notes (Signed)
Body aches, fever, nasal congestion, nonproductive cough. Generalized and fatigue while working outside Sunday.

## 2017-05-25 NOTE — Discharge Instructions (Signed)
Follow-up with [facility] clinic acute-care if any continued problems. Begin taking antibiotics twice a day for the next 10 days. Begin taking prednisone as directed for the next 6 days. Tessalon Perles 2 every 8 hours for coughing. Increase fluids. Discontinue smoking.

## 2017-05-25 NOTE — ED Notes (Signed)
Upon entering room to discharge patient, room is empty. Patient is not in the restrooms or in the lobby. Unable to obtain discharge vital signs. Patients cell number called and a voice mail was left instructing patient to pick up his discharge paperwork and prescriptions.

## 2017-05-26 ENCOUNTER — Emergency Department (HOSPITAL_COMMUNITY)
Admission: EM | Admit: 2017-05-26 | Discharge: 2017-05-26 | Disposition: A | Discharge status: Home / Self Care | Payer: PRIVATE HEALTH INSURANCE | Attending: Emergency Medicine | Admitting: Emergency Medicine

## 2017-05-26 ENCOUNTER — Encounter (HOSPITAL_COMMUNITY): Payer: Self-pay | Admitting: Emergency Medicine

## 2017-05-26 DIAGNOSIS — Z5321 Procedure and treatment not carried out due to patient leaving prior to being seen by health care provider: Secondary | ICD-10-CM | POA: Insufficient documentation

## 2017-05-26 DIAGNOSIS — R05 Cough: Secondary | ICD-10-CM | POA: Insufficient documentation

## 2017-05-26 NOTE — ED Triage Notes (Signed)
Pt with cough, congestion and nasal drainage since Sunday.

## 2017-05-26 NOTE — ED Notes (Signed)
Pt called from the lobby with no response 

## 2017-05-26 NOTE — ED Notes (Signed)
Patient called with no answer for assigned room.

## 2017-08-29 ENCOUNTER — Inpatient Hospital Stay
Admission: EM | Admit: 2017-08-29 | Discharge: 2017-08-31 | DRG: 603 | Disposition: A | Discharge status: Home / Self Care | Payer: PRIVATE HEALTH INSURANCE | Attending: Specialist | Admitting: Specialist

## 2017-08-29 ENCOUNTER — Other Ambulatory Visit: Payer: Self-pay

## 2017-08-29 ENCOUNTER — Emergency Department: Payer: PRIVATE HEALTH INSURANCE

## 2017-08-29 ENCOUNTER — Encounter: Payer: Self-pay | Admitting: Emergency Medicine

## 2017-08-29 DIAGNOSIS — F17210 Nicotine dependence, cigarettes, uncomplicated: Secondary | ICD-10-CM | POA: Diagnosis present

## 2017-08-29 DIAGNOSIS — E871 Hypo-osmolality and hyponatremia: Secondary | ICD-10-CM | POA: Diagnosis present

## 2017-08-29 DIAGNOSIS — L0390 Cellulitis, unspecified: Secondary | ICD-10-CM | POA: Diagnosis present

## 2017-08-29 DIAGNOSIS — L03116 Cellulitis of left lower limb: Secondary | ICD-10-CM

## 2017-08-29 DIAGNOSIS — M869 Osteomyelitis, unspecified: Secondary | ICD-10-CM | POA: Diagnosis present

## 2017-08-29 DIAGNOSIS — Z79899 Other long term (current) drug therapy: Secondary | ICD-10-CM

## 2017-08-29 DIAGNOSIS — L02612 Cutaneous abscess of left foot: Principal | ICD-10-CM | POA: Diagnosis present

## 2017-08-29 DIAGNOSIS — R7989 Other specified abnormal findings of blood chemistry: Secondary | ICD-10-CM | POA: Diagnosis present

## 2017-08-29 DIAGNOSIS — G629 Polyneuropathy, unspecified: Secondary | ICD-10-CM | POA: Diagnosis present

## 2017-08-29 LAB — CBC WITH DIFFERENTIAL/PLATELET
Basophils Absolute: 0.1 10*3/uL (ref 0–0.1)
Basophils Relative: 1 %
EOS ABS: 0.1 10*3/uL (ref 0–0.7)
Eosinophils Relative: 1 %
HEMATOCRIT: 45.4 % (ref 40.0–52.0)
HEMOGLOBIN: 15.1 g/dL (ref 13.0–18.0)
LYMPHS ABS: 1.9 10*3/uL (ref 1.0–3.6)
Lymphocytes Relative: 12 %
MCH: 29.9 pg (ref 26.0–34.0)
MCHC: 33.3 g/dL (ref 32.0–36.0)
MCV: 89.8 fL (ref 80.0–100.0)
MONO ABS: 1 10*3/uL (ref 0.2–1.0)
MONOS PCT: 7 %
NEUTROS ABS: 12.5 10*3/uL — AB (ref 1.4–6.5)
Neutrophils Relative %: 79 %
Platelets: 349 10*3/uL (ref 150–440)
RBC: 5.06 MIL/uL (ref 4.40–5.90)
RDW: 14.8 % — AB (ref 11.5–14.5)
WBC: 15.6 10*3/uL — ABNORMAL HIGH (ref 3.8–10.6)

## 2017-08-29 LAB — TSH: TSH: 1.478 u[IU]/mL (ref 0.350–4.500)

## 2017-08-29 LAB — COMPREHENSIVE METABOLIC PANEL
ALBUMIN: 4 g/dL (ref 3.5–5.0)
ALK PHOS: 65 U/L (ref 38–126)
ALT: 124 U/L — AB (ref 17–63)
AST: 73 U/L — ABNORMAL HIGH (ref 15–41)
Anion gap: 8 (ref 5–15)
BUN: 17 mg/dL (ref 6–20)
CHLORIDE: 98 mmol/L — AB (ref 101–111)
CO2: 25 mmol/L (ref 22–32)
CREATININE: 0.85 mg/dL (ref 0.61–1.24)
Calcium: 9.1 mg/dL (ref 8.9–10.3)
GFR calc Af Amer: >60 mL/min (ref >60)
GFR calc non Af Amer: >60 mL/min (ref >60)
GLUCOSE: 94 mg/dL (ref 65–99)
Potassium: 4.1 mmol/L (ref 3.5–5.1)
SODIUM: 131 mmol/L — AB (ref 135–145)
Total Bilirubin: 0.5 mg/dL (ref 0.3–1.2)
Total Protein: 8.1 g/dL (ref 6.5–8.1)

## 2017-08-29 LAB — SEDIMENTATION RATE: Sed Rate: 9 mm/hr (ref 0–15)

## 2017-08-29 MED ORDER — B COMPLEX-C PO TABS
1.0000 | ORAL_TABLET | Freq: Every day | ORAL | Status: DC
  Administered 2017-08-30 – 2017-08-31 (×2): 1 via ORAL
  Filled 2017-08-29 (×3): qty 1

## 2017-08-29 MED ORDER — HYDROMORPHONE HCL 1 MG/ML IJ SOLN
1.0000 mg | Freq: Once | INTRAMUSCULAR | Status: AC
  Administered 2017-08-29: 1 mg via INTRAVENOUS

## 2017-08-29 MED ORDER — SODIUM CHLORIDE 0.9 % IV BOLUS (SEPSIS)
1000.0000 mL | Freq: Once | INTRAVENOUS | Status: AC
  Administered 2017-08-29: 1000 mL via INTRAVENOUS

## 2017-08-29 MED ORDER — CLINDAMYCIN PHOSPHATE 600 MG/50ML IV SOLN
600.0000 mg | Freq: Once | INTRAVENOUS | Status: AC
  Administered 2017-08-29: 600 mg via INTRAVENOUS
  Filled 2017-08-29: qty 50

## 2017-08-29 MED ORDER — VANCOMYCIN HCL IN DEXTROSE 1-5 GM/200ML-% IV SOLN
1000.0000 mg | Freq: Once | INTRAVENOUS | Status: AC
  Administered 2017-08-29: 1000 mg via INTRAVENOUS
  Filled 2017-08-29 (×2): qty 200

## 2017-08-29 MED ORDER — HYDROMORPHONE HCL 1 MG/ML IJ SOLN
1.0000 mg | Freq: Once | INTRAMUSCULAR | Status: AC
  Administered 2017-08-29: 1 mg via INTRAVENOUS
  Filled 2017-08-29: qty 1

## 2017-08-29 MED ORDER — LIDOCAINE-EPINEPHRINE (PF) 1 %-1:200000 IJ SOLN
30.0000 mL | Freq: Once | INTRAMUSCULAR | Status: AC
  Administered 2017-08-29: 30 mL

## 2017-08-29 MED ORDER — SODIUM CHLORIDE 0.9 % IV SOLN
INTRAVENOUS | Status: AC
  Administered 2017-08-29: 23:00:00 via INTRAVENOUS

## 2017-08-29 MED ORDER — PIPERACILLIN-TAZOBACTAM 3.375 G IVPB
3.3750 g | Freq: Three times a day (TID) | INTRAVENOUS | Status: DC
  Administered 2017-08-29 – 2017-08-31 (×5): 3.375 g via INTRAVENOUS
  Filled 2017-08-29 (×5): qty 50

## 2017-08-29 MED ORDER — LIDOCAINE-EPINEPHRINE (PF) 1 %-1:200000 IJ SOLN
INTRAMUSCULAR | Status: AC
  Administered 2017-08-29: 30 mL
  Filled 2017-08-29: qty 30

## 2017-08-29 MED ORDER — MORPHINE SULFATE (PF) 2 MG/ML IV SOLN
2.0000 mg | INTRAVENOUS | Status: DC | PRN
  Administered 2017-08-30 – 2017-08-31 (×4): 2 mg via INTRAVENOUS
  Filled 2017-08-29 (×5): qty 1

## 2017-08-29 MED ORDER — POLYETHYLENE GLYCOL 3350 17 G PO PACK: 17.0000 g | PACK | Freq: Every day | ORAL | Status: DC | PRN

## 2017-08-29 MED ORDER — OXYCODONE HCL 5 MG PO TABS
5.0000 mg | ORAL_TABLET | ORAL | Status: DC | PRN
  Administered 2017-08-29 – 2017-08-31 (×7): 5 mg via ORAL
  Filled 2017-08-29 (×7): qty 1

## 2017-08-29 MED ORDER — ONDANSETRON HCL 4 MG PO TABS: 4.0000 mg | ORAL_TABLET | Freq: Four times a day (QID) | ORAL | Status: DC | PRN

## 2017-08-29 MED ORDER — NICOTINE 21 MG/24HR TD PT24
21.0000 mg | MEDICATED_PATCH | Freq: Every day | TRANSDERMAL | Status: DC
  Administered 2017-08-30 – 2017-08-31 (×2): 21 mg via TRANSDERMAL
  Filled 2017-08-29 (×2): qty 1

## 2017-08-29 MED ORDER — DOCUSATE SODIUM 100 MG PO CAPS
100.0000 mg | ORAL_CAPSULE | Freq: Two times a day (BID) | ORAL | Status: DC
  Administered 2017-08-29 – 2017-08-31 (×4): 100 mg via ORAL
  Filled 2017-08-29 (×4): qty 1

## 2017-08-29 MED ORDER — NICOTINE 14 MG/24HR TD PT24
14.0000 mg | MEDICATED_PATCH | Freq: Once | TRANSDERMAL | Status: AC
  Administered 2017-08-29: 14 mg via TRANSDERMAL
  Filled 2017-08-29: qty 1

## 2017-08-29 MED ORDER — LIDOCAINE-EPINEPHRINE 2 %-1:100000 IJ SOLN: 20.0000 mL | Freq: Once | INTRAMUSCULAR | Status: DC

## 2017-08-29 MED ORDER — HYDROMORPHONE HCL 1 MG/ML IJ SOLN
INTRAMUSCULAR | Status: AC
  Filled 2017-08-29: qty 1

## 2017-08-29 MED ORDER — ENOXAPARIN SODIUM 40 MG/0.4ML ~~LOC~~ SOLN
40.0000 mg | SUBCUTANEOUS | Status: DC
  Filled 2017-08-29 (×2): qty 0.4

## 2017-08-29 MED ORDER — ONDANSETRON HCL 4 MG/2ML IJ SOLN: 4.0000 mg | Freq: Four times a day (QID) | INTRAMUSCULAR | Status: DC | PRN

## 2017-08-29 MED ORDER — ACETAMINOPHEN 325 MG PO TABS: 650.0000 mg | ORAL_TABLET | Freq: Four times a day (QID) | ORAL | Status: DC | PRN

## 2017-08-29 MED ORDER — VANCOMYCIN HCL 10 G IV SOLR
1250.0000 mg | Freq: Two times a day (BID) | INTRAVENOUS | Status: DC
  Administered 2017-08-30 – 2017-08-31 (×3): 1250 mg via INTRAVENOUS
  Filled 2017-08-29 (×5): qty 1250

## 2017-08-29 MED ORDER — ACETAMINOPHEN 650 MG RE SUPP: 650.0000 mg | Freq: Four times a day (QID) | RECTAL | Status: DC | PRN

## 2017-08-29 NOTE — Consult Note (Signed)
Pharmacy Antibiotic Note  Barry Wheeler is a 47 y.o. male admitted on 08/29/2017 with  Cellulitis/wound infection. Pharmacy has been consulted for Zosyn and vancomycin dosing.  Plan: Ke:0.087   T1/2: 8   Vd:52.4  Vancomycin 1g IV x 1. Followed by vancomycin 1250mg  IV every 12 hours, with 6 hour stack dosing. Calculated trough at Css is 14. Trough level ordered prior to 4th dose. Will monitor renal function and adjust dose as needed.   Start Zosyn 3.375 IV EI every 8 hours.   Height: 5\' 6"  (167.6 cm) Weight: 165 lb (74.8 kg) IBW/kg (Calculated) : 63.8  Temp (24hrs), Avg:98.2 F (36.8 C), Min:98.2 F (36.8 C), Max:98.2 F (36.8 C)  Recent Labs  Lab 08/29/17 1514  WBC 15.6*  CREATININE 0.85    Estimated Creatinine Clearance: 100.1 mL/min (by C-G formula based on SCr of 0.85 mg/dL).    No Known Allergies  Antimicrobials this admission: 1/13 Zosyn >>  1/13 vancomycin  >>   Dose adjustments this admission:  Microbiology results: 1/13  BCx: pending 1/13 Wound Cx: pending   Thank you for allowing pharmacy to be a part of this patient's care.  Gardner CandleSheema M Lisaann Atha, PharmD, BCPS Clinical Pharmacist 08/29/2017 7:53 PM

## 2017-08-29 NOTE — H&P (Signed)
[facility] - [place] [facility]   PATIENT NAME: Barry Wheeler    MR#:  [id number]  DATE OF BIRTH:  1971-08-01  DATE OF ADMISSION:  08/29/2017  PRIMARY CARE PHYSICIAN: Patient, No Pcp Per   REQUESTING/REFERRING PHYSICIAN: Dr. Etheleen Nicks  CHIEF COMPLAINT:   Chief Complaint  Patient presents with  . Wound Infection    HISTORY OF PRESENT ILLNESS:  Barry Wheeler  is a 47 y.o. male with a known history of smoking, cocaine use and no other significant medical problems presents to hospital secondary to worsening swelling and pain of his left foot. Patient denies any trauma to the foot. He started noticing redness, blister on the dorsum of his left foot about a week ago. It started to get worse with the fluctuant spot and the redness and swelling spreading to the whole foot and now onto the leg. Denies any fevers or chills. No nausea or vomiting. Not a diabetic. Since the pain was unbearable and was hampering him to walk around, he presented to the emergency room. X-ray of the foot did not show any osteomyelitis changes. Incision and drainage of the abscess was done in the emergency room. He is being admitted for IV antibiotics.  PAST MEDICAL HISTORY:   Past Medical History:  Diagnosis Date  . Kidney stones     PAST SURGICAL HISTORY:   Past Surgical History:  Procedure Laterality Date  . WRIST SURGERY      SOCIAL HISTORY:   Social History   Tobacco Use  . Smoking status: Current Every Day Smoker    Packs/day: 1.00    Types: Cigarettes  . Smokeless tobacco: Never Used  Substance Use Topics  . Alcohol use: No    FAMILY HISTORY:   Family History  Problem Relation Age of Onset  . Hypertension Mother   . CAD Father     DRUG ALLERGIES:  No Known Allergies  REVIEW OF SYSTEMS:   Review of Systems  Constitutional: Negative for chills, fever, malaise/fatigue and weight loss.  HENT: Negative for ear discharge, ear pain, hearing loss,  nosebleeds and tinnitus.   Eyes: Negative for blurred vision, double vision and photophobia.  Respiratory: Negative for cough, hemoptysis, shortness of breath and wheezing.   Cardiovascular: Negative for chest pain, palpitations, orthopnea and leg swelling.  Gastrointestinal: Negative for abdominal pain, constipation, diarrhea, heartburn, melena, nausea and vomiting.  Genitourinary: Negative for dysuria, frequency, hematuria and urgency.  Musculoskeletal: Positive for joint pain and myalgias. Negative for back pain and neck pain.  Skin: Negative for rash.  Neurological: Positive for tingling. Negative for dizziness, tremors, sensory change, speech change, focal weakness and headaches.  Endo/Heme/Allergies: Does not bruise/bleed easily.  Psychiatric/Behavioral: Negative for depression.    MEDICATIONS AT HOME:   Prior to Admission medications   Medication Sig Start Date End Date Taking? Authorizing Provider  benzonatate (TESSALON PERLES) 100 MG capsule Take 2 capsules (200 mg total) by mouth 3 (three) times daily as needed. Patient not taking: Reported on 08/29/2017 05/25/17 05/25/18  Tommi Rumps, PA-C  predniSONE (DELTASONE) 10 MG tablet Take 6 tablets  today, on day 2 take 5 tablets, day 3 take 4 tablets, day 4 take 3 tablets, day 5 take  2 tablets and 1 tablet the last day Patient not taking: Reported on 08/29/2017 05/25/17   Tommi Rumps, PA-C  sulfamethoxazole-trimethoprim (BACTRIM DS,SEPTRA DS) 800-160 MG tablet Take 1 tablet by mouth 2 (two) times daily. Patient not taking: Reported on 08/29/2017 05/25/17  Tommi Rumps, PA-C      VITAL SIGNS:  Blood pressure 133/79, pulse 85, temperature 98.2 F (36.8 C), temperature source Oral, resp. rate 18, height 5\' 6"  (1.676 m), weight 74.8 kg (165 lb), SpO2 97 %.  PHYSICAL EXAMINATION:   Physical Exam  GENERAL:  47 y.o.-year-old patient lying in the bed with no acute distress.  EYES: Pupils equal, round, reactive to light and  accommodation. No scleral icterus. Extraocular muscles intact.  HEENT: Head atraumatic, normocephalic. Oropharynx and nasopharynx clear.  NECK:  Supple, no jugular venous distention. No thyroid enlargement, no tenderness.  LUNGS: Normal breath sounds bilaterally, no wheezing, rales,rhonchi or crepitation. No use of accessory muscles of respiration.  CARDIOVASCULAR: S1, S2 normal. No murmurs, rubs, or gallops.  ABDOMEN: Soft, nontender, nondistended. Bowel sounds present. No organomegaly or mass.  EXTREMITIES: Left foot is swollen and erythematous with the swelling and redness spreading onto the distal one third of his leg. There is then incised wound on the dorsum of the foot with packing inside. No  cyanosis, or clubbing.  NEUROLOGIC: Cranial nerves II through XII are intact. Muscle strength 5/5 in all extremities. Sensation intact. Gait not checked.  PSYCHIATRIC: The patient is alert and oriented x 3.  SKIN: No obvious rash, lesion, or ulcer.   LABORATORY PANEL:   CBC Recent Labs  Lab 08/29/17 1514  WBC 15.6*  HGB 15.1  HCT 45.4  PLT 349   ------------------------------------------------------------------------------------------------------------------  Chemistries  Recent Labs  Lab 08/29/17 1514  NA 131*  K 4.1  CL 98*  CO2 25  GLUCOSE 94  BUN 17  CREATININE 0.85  CALCIUM 9.1  AST 73*  ALT 124*  ALKPHOS 65  BILITOT 0.5   ------------------------------------------------------------------------------------------------------------------  Cardiac Enzymes No results for input(s): TROPONINI in the last 168 hours. ------------------------------------------------------------------------------------------------------------------  RADIOLOGY:  Dg Foot Complete Left  Result Date: 08/29/2017 CLINICAL DATA:  Left foot pain and redness for 5 days. EXAM: LEFT FOOT - COMPLETE 3+ VIEW COMPARISON:  None. FINDINGS: There is no evidence of fracture or dislocation. There is no evidence  of arthropathy or other focal bone abnormality. No radiopaque foreign body is identified. IMPRESSION: No acute abnormality. Electronically Signed   By: Sherian Rein M.D.   On: 08/29/2017 16:18    EKG:   Orders placed or performed during the hospital encounter of 05/26/17  . EKG    IMPRESSION AND PLAN:   Barry Wheeler  is a 47 y.o. male with a known history of smoking, cocaine use and no other significant medical problems presents to hospital secondary to worsening swelling and pain of his left foot.  1. Left foot cellulitis with abscess-status post I&D in the emergency room. Wound cultures have been sent for. -Significant cellulitis, keep the left foot elevated. -Started on vancomycin and Zosyn at this time. -X ray Negative for osteomyelitis.  2. Elevated LFTs-no history of alcohol use. Check acute hepatitis panel due to history of drug use. Monitor LFTs  3. 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 Has tingling numbness and peripheral neuropathy symptoms of both his hands. 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 -Check B12 levels and started on B complex  4. Polysubstance abuse-counseled against smoking. Check urine drug screen. -Started on nicotine patch. Denies alcohol use  5. DVT prophylaxis-on Lovenox    All the records are reviewed and case discussed with ED provider. Management plans discussed with the patient, family and they are in agreement.  CODE STATUS: Full Code  TOTAL TIME  TAKING CARE OF THIS PATIENT: 50 minutes.    Barry Wheeler,Davie Sagona M.D on 08/29/2017 at 7:20 PM  Between 7am to 6pm - Pager - [phone]  After 6pm go to [email or web address] - password [email or web address]  [facility]  Office  [phone]  CC: Primary care physician; Patient, No Pcp Per

## 2017-08-29 NOTE — ED Triage Notes (Addendum)
Pt has noted redness to top of left foot with blackish/purple center. Warm to touch. Present X 5 days.  No known fevers.  Pain with ambulation and movement. Difficulty moving toes r/t pain.  Someone stepped on foot but redness and swelling started prior.

## 2017-08-29 NOTE — ED Provider Notes (Addendum)
[facility] Emergency Department Provider Note  ____________________________________________  Time seen: Approximately 5:02 PM  I have reviewed the triage vital signs and the nursing notes.   HISTORY  Chief Complaint Wound Infection    HPI Barry Wheeler is a 47 y.o. male, nondiabetic and not immunocompromised, presenting w/ swelling and pain of the dorsum of the left foot.  The pt reports that over the past few days, he has had a progressively worsening pain w/ enlarging central area of bruising and pain.  When he lowers his foot, the pain shoots up his leg.  He has tried elevation, ice and heat without improvement.  No systemic sx's including fever, chills, n/v.   Past Medical History:  Diagnosis Date  . Kidney stones     There are no active problems to display for this patient.   Past Surgical History:  Procedure Laterality Date  . WRIST SURGERY      Current Outpatient Rx  . Order #: [id number] Class: Print  . Order #: [id number] Class: Print  . Order #: [id number] Class: Print    Allergies Patient has no known allergies.  History reviewed. No pertinent family history.  Social History Social History   Tobacco Use  . Smoking status: Current Every Day Smoker    Packs/day: 1.00    Types: Cigarettes  . Smokeless tobacco: Never Used  Substance Use Topics  . Alcohol use: No  . Drug use: Yes    Frequency: 2.0 times per week    Types: Cocaine    Review of Systems Constitutional: No fever/chills. ENT: No sore throat. No congestion or rhinorrhea. Cardiovascular: Denies chest pain. Denies palpitations. Respiratory: Denies shortness of breath.  No cough. Gastrointestinal: No abdominal pain.  No nausea, no vomiting.  No diarrhea.  No constipation. Genitourinary: Negative for dysuria. Musculoskeletal: Negative for back pain. Skin: + for erythema pain and swelling L foot Neurological: Negative for headaches. No focal numbness, tingling or  weakness.     ____________________________________________   PHYSICAL EXAM:  VITAL SIGNS: ED Triage Vitals [08/29/17 1517]  Enc Vitals Group     BP 133/79     Pulse Rate 85     Resp 18     Temp 98.2 F (36.8 C)     Temp Source Oral     SpO2 97 %     Weight 165 lb (74.8 kg)     Height 5\' 6"  (1.676 m)     Head Circumference      Peak Flow      Pain Score 9     Pain Loc      Pain Edu?      Excl. in GC?     Constitutional: Alert and oriented.  Chronically ill appearing but in no acute distress. Answers questions appropriately. Eyes: Conjunctivae are normal.  EOMI. No scleral icterus. Head: Atraumatic. Nose: No congestion/rhinnorhea. Mouth/Throat: Mucous membranes are moist.  Neck: No stridor.  Supple.   Cardiovascular: Normal rate, regular rhythm. No murmurs, rubs or gallops.  Respiratory: Normal respiratory effort.  No accessory muscle use or retractions. Lungs CTAB.  No wheezes, rales or ronchi. Musculoskeletal: Full range of motion of the left ankle and left knee without significant pain.  Normal DP and PT pulses on the left. Neurologic:  A&Ox3.  Speech is clear.  Face and smile are symmetric.  EOMI.  Moves all extremities well. Skin: The patient has a central area of fluctuance with bruising and pain that is approximately 4 x 5 cm in  circular over the dorsum of the foot on the left.  There is no discharge.  He has surrounding erythema that goes to towards the ankle with some minimal lymphatic streaking. Psychiatric: Mood and affect are normal. Speech and behavior are normal.  Normal judgement.  ____________________________________________   LABS (all labs ordered are listed, but only abnormal results are displayed)  Labs Reviewed  COMPREHENSIVE METABOLIC PANEL - Abnormal; Notable for the following components:      Result Value   Sodium 131 (*)    Chloride 98 (*)    AST 73 (*)    ALT 124 (*)    All other components within normal limits  CBC WITH  DIFFERENTIAL/PLATELET - Abnormal; Notable for the following components:   WBC 15.6 (*)    RDW 14.8 (*)    Neutro Abs 12.5 (*)    All other components within normal limits  CULTURE, BLOOD (ROUTINE X 2)  CULTURE, BLOOD (ROUTINE X 2)  AEROBIC CULTURE (SUPERFICIAL SPECIMEN)  SEDIMENTATION RATE   ____________________________________________  EKG  Not indicated ____________________________________________  RADIOLOGY  Dg Foot Complete Left  Result Date: 08/29/2017 CLINICAL DATA:  Left foot pain and redness for 5 days. EXAM: LEFT FOOT - COMPLETE 3+ VIEW COMPARISON:  None. FINDINGS: There is no evidence of fracture or dislocation. There is no evidence of arthropathy or other focal bone abnormality. No radiopaque foreign body is identified. IMPRESSION: No acute abnormality. Electronically Signed   By: Sherian ReinWei-Chen  Lin M.D.   On: 08/29/2017 16:18    ____________________________________________   PROCEDURES  Procedure(s) performed: None  .Marland Kitchen.Incision and Drainage Date/Time: 08/29/2017 6:07 PM Performed by: Rockne MenghiniNorman, Anne-Caroline, MD Authorized by: Rockne MenghiniNorman, Anne-Caroline, MD   Consent:    Consent obtained:  Verbal   Consent given by:  Patient   Risks discussed:  Bleeding, incomplete drainage, pain, infection and damage to other organs   Alternatives discussed:  No treatment and observation Location:    Type:  Abscess   Size:  3x2cm   Location:  Lower extremity   Lower extremity location:  Foot   Foot location:  L foot Pre-procedure details:    Skin preparation:  Chloraprep Anesthesia (see MAR for exact dosages):    Anesthesia method:  Local infiltration   Local anesthetic:  Lidocaine 1% WITH epi Procedure type:    Complexity:  Simple Procedure details:    Needle aspiration: no     Incision types:  Single straight   Incision depth:  Dermal   Scalpel blade:  10   Wound management:  Probed and deloculated   Drainage:  Bloody and purulent   Drainage amount:  Moderate   Wound  treatment:  Wound left open   Packing materials:  1/4 in gauze   Amount 1/4":  2" Post-procedure details:    Patient tolerance of procedure:  Tolerated well, no immediate complications    Critical Care performed: No ____________________________________________   INITIAL IMPRESSION / ASSESSMENT AND PLAN / ED COURSE  Pertinent labs & imaging results that were available during my care of the patient were reviewed by me and considered in my medical decision making (see chart for details).  47 y.o. male, not immunocompromised or diabetic, presenting with left foot erythema, swelling, and fluctuance with surrounding erythema in the foot.  The patient is not reporting any systemic symptoms.  The extent of this abnormality is concerning for a central abscess, so we will plan to incise and drain the area, and initiate clindamycin intravenously.  Blood cultures will be obtained  and a wound culture as well.  Anticipate admission for further evaluation and treatment  ____________________________________________  FINAL CLINICAL IMPRESSION(S) / ED DIAGNOSES  Final diagnoses:  Hyponatremia  Abscess of left foot  Cellulitis of left foot         NEW MEDICATIONS STARTED DURING THIS VISIT:  New Prescriptions   No medications on file      Rockne Menghini, MD 08/29/17 1710    Rockne Menghini, MD 08/29/17 [id number]

## 2017-08-29 NOTE — ED Triage Notes (Signed)
First Nurse Note:  C/O left foot pain, swelling, redness x 3-5 days.  Patient denies injury.

## 2017-08-29 NOTE — ED Notes (Signed)
Pt transported to room 158. 

## 2017-08-29 NOTE — ED Notes (Addendum)
Pt also mentioned had an enlarged prostate last time here but has not seen the urologist because does not have insurance

## 2017-08-30 LAB — BASIC METABOLIC PANEL
ANION GAP: 8 (ref 5–15)
BUN: 11 mg/dL (ref 6–20)
CALCIUM: 8.2 mg/dL — AB (ref 8.9–10.3)
CO2: 26 mmol/L (ref 22–32)
Chloride: 103 mmol/L (ref 101–111)
Creatinine, Ser: 0.77 mg/dL (ref 0.61–1.24)
Glucose, Bld: 110 mg/dL — ABNORMAL HIGH (ref 65–99)
Potassium: 3.8 mmol/L (ref 3.5–5.1)
SODIUM: 137 mmol/L (ref 135–145)

## 2017-08-30 LAB — HEPATIC FUNCTION PANEL
ALBUMIN: 3.1 g/dL — AB (ref 3.5–5.0)
ALK PHOS: 53 U/L (ref 38–126)
ALT: 90 U/L — ABNORMAL HIGH (ref 17–63)
AST: 54 U/L — AB (ref 15–41)
BILIRUBIN TOTAL: 0.5 mg/dL (ref 0.3–1.2)
Bilirubin, Direct: <0.1 mg/dL — ABNORMAL LOW (ref 0.1–0.5)
Total Protein: 6.1 g/dL — ABNORMAL LOW (ref 6.5–8.1)

## 2017-08-30 LAB — CBC
HCT: 43.9 % (ref 40.0–52.0)
Hemoglobin: 14.6 g/dL (ref 13.0–18.0)
MCH: 29.8 pg (ref 26.0–34.0)
MCHC: 33.3 g/dL (ref 32.0–36.0)
MCV: 89.6 fL (ref 80.0–100.0)
PLATELETS: 257 10*3/uL (ref 150–440)
RBC: 4.9 MIL/uL (ref 4.40–5.90)
RDW: 14.6 % — ABNORMAL HIGH (ref 11.5–14.5)
WBC: 7 10*3/uL (ref 3.8–10.6)

## 2017-08-30 LAB — URINE DRUG SCREEN, QUALITATIVE (ARMC ONLY)
Amphetamines, Ur Screen: NOT DETECTED
BARBITURATES, UR SCREEN: NOT DETECTED
Benzodiazepine, Ur Scrn: NOT DETECTED
CANNABINOID 50 NG, UR ~~LOC~~: NOT DETECTED
COCAINE METABOLITE, UR ~~LOC~~: POSITIVE — AB
MDMA (Ecstasy)Ur Screen: NOT DETECTED
Methadone Scn, Ur: NOT DETECTED
OPIATE, UR SCREEN: POSITIVE — AB
Phencyclidine (PCP) Ur S: NOT DETECTED
TRICYCLIC, UR SCREEN: NOT DETECTED

## 2017-08-30 LAB — VITAMIN B12: Vitamin B-12: 526 pg/mL (ref 180–914)

## 2017-08-30 NOTE — Progress Notes (Signed)
[facility]    Elyn AquasClyde Lukas was admitted to the Hospital on 08/29/2017 and is currently being treated for Cellulits/abcess of his left foot.  He will need to be in the hospital for the next 2 days for treatment of his infection.    Call Hilda LiasVivek Sainani MD, [facility]  [phone] with questions.  Houston SirenSAINANI,VIVEK J M.D on 08/30/2017,at 10:05 AM

## 2017-08-30 NOTE — Progress Notes (Signed)
[facility] [place] at [facility]   PATIENT NAME: Barry Wheeler    MR#:  [id number]  DATE OF BIRTH:  Jan 30, 1971  SUBJECTIVE:   Patient here due to her left foot cellulitis with abscess. Patient is status post incision and drainage in the ER. Currently on IV antibiotics and redness is improved. Afebrile, hemodynamically stable.  REVIEW OF SYSTEMS:    Review of Systems  Constitutional: Negative for chills and fever.  HENT: Negative for congestion and tinnitus.   Eyes: Negative for blurred vision and double vision.  Respiratory: Negative for cough, shortness of breath and wheezing.   Cardiovascular: Negative for chest pain, orthopnea and PND.  Gastrointestinal: Negative for abdominal pain, diarrhea, nausea and vomiting.  Genitourinary: Negative for dysuria and hematuria.  Skin: Positive for rash (Left foot redness/swelling due to cellulitis/abscess).  Neurological: Negative for dizziness, sensory change and focal weakness.  All other systems reviewed and are negative.   Nutrition: Regular Tolerating Diet: Yes Tolerating PT: Ambulatory  DRUG ALLERGIES:  No Known Allergies  VITALS:  Blood pressure 114/68, pulse 62, temperature 98.9 F (37.2 C), temperature source Oral, resp. rate 18, height 5\' 7"  (1.702 m), weight 73.9 kg (162 lb 14.4 oz), SpO2 97 %.  PHYSICAL EXAMINATION:   Physical Exam  GENERAL:  47 y.o.-year-old patient lying in bed in no acute distress.  EYES: Pupils equal, round, reactive to light and accommodation. No scleral icterus. Extraocular muscles intact.  HEENT: Head atraumatic, normocephalic. Oropharynx and nasopharynx clear.  NECK:  Supple, no jugular venous distention. No thyroid enlargement, no tenderness.  LUNGS: Normal breath sounds bilaterally, no wheezing, rales, rhonchi. No use of accessory muscles of respiration.  CARDIOVASCULAR: S1, S2 normal. No murmurs, rubs, or gallops.  ABDOMEN: Soft, nontender, nondistended. Bowel sounds  present. No organomegaly or mass.  EXTREMITIES: No cyanosis, clubbing or edema b/l.    NEUROLOGIC: Cranial nerves II through XII are intact. No focal Motor or sensory deficits b/l.   PSYCHIATRIC: The patient is alert and oriented x 3.  SKIN: No obvious rash, lesion, or ulcer. Left foot redness swelling on the dorsum part of the foot secondary to cellulitis. Patient is status post incision and drainage and s/p packing to the abscess on the dorsum part of the foot.   LABORATORY PANEL:   CBC Recent Labs  Lab 08/30/17 0322  WBC 7.0  HGB 14.6  HCT 43.9  PLT 257   ------------------------------------------------------------------------------------------------------------------  Chemistries  Recent Labs  Lab 08/30/17 0322  NA 137  K 3.8  CL 103  CO2 26  GLUCOSE 110*  BUN 11  CREATININE 0.77  CALCIUM 8.2*  AST 54*  ALT 90*  ALKPHOS 53  BILITOT 0.5   ------------------------------------------------------------------------------------------------------------------  Cardiac Enzymes No results for input(s): TROPONINI in the last 168 hours. ------------------------------------------------------------------------------------------------------------------  RADIOLOGY:  Dg Foot Complete Left  Result Date: 08/29/2017 CLINICAL DATA:  Left foot pain and redness for 5 days. EXAM: LEFT FOOT - COMPLETE 3+ VIEW COMPARISON:  None. FINDINGS: There is no evidence of fracture or dislocation. There is no evidence of arthropathy or other focal bone abnormality. No radiopaque foreign body is identified. IMPRESSION: No acute abnormality. Electronically Signed   By: Sherian ReinWei-Chen  Lin M.D.   On: 08/29/2017 16:18     ASSESSMENT AND PLAN:   Barry AquasClyde Yzaguirre  is a 47 y.o. male with a known history of smoking, cocaine use and no other significant medical problems presents to hospital secondary to worsening swelling and pain of his left foot.  1. Left foot cellulitis with abscess-status post I&D in the  emergency room. Wound cultures have been sent and are still pending.  - Continued IV vancomycin, Zosyn. X-ray was negative for osteomyelitis. Currently afebrile and hemodynamically stable.  2. Elevated LFTs-no history of alcohol use.  -LFTs improved since yesterday. Await hepatitis panel.  3. 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 Has tingling numbness and peripheral neuropathy symptoms of both his hands. 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 - B12 level is normal. Cont. Supplements.   4. Polysubstance abuse-cont. Nicotine patch.   Possible d/c home on Oral abx. Tomorrow if clinically doing well.    All the records are reviewed and case discussed with Care Management/Social Worker. Management plans discussed with the patient, family and they are in agreement.  CODE STATUS: Full code  DVT Prophylaxis: Lovenox  TOTAL TIME TAKING CARE OF THIS PATIENT: 30 minutes.   POSSIBLE D/C IN 1-2 DAYS, DEPENDING ON CLINICAL CONDITION.   Houston Siren M.D on 08/30/2017 at 1:17 PM  Between 7am to 6pm - Pager - [phone]  After 6pm go to [email or web address] - Social research officer, government  [facility] [place] [facility]  Office  [phone]  CC: Primary care physician; Patient, No Pcp Per

## 2017-08-30 NOTE — Progress Notes (Signed)
Patient's mother stated that patient had a court appointment today.  She asked if we could fax over a paper stating that patient was in the hospital.   Faxed this information to "Judeth CornfieldStephanie" at [phone] and put a copy in patients chart.

## 2017-08-30 NOTE — Progress Notes (Signed)
Redressed wound and gave patient education on keeping wound clean and dry.  Cellulitis appears to have improved with abx.  Patient afebrile and WBC are normal.  Continue to monitor.

## 2017-08-31 LAB — HEPATITIS PANEL, ACUTE
HCV Ab: >11 s/co ratio — ABNORMAL HIGH (ref 0.0–0.9)
HEP B S AG: NEGATIVE
Hep A IgM: NEGATIVE
Hep B C IgM: NEGATIVE

## 2017-08-31 LAB — HIV ANTIBODY (ROUTINE TESTING W REFLEX): HIV SCREEN 4TH GENERATION: NONREACTIVE

## 2017-08-31 MED ORDER — OXYCODONE HCL 5 MG PO TABS: 5.0000 mg | ORAL_TABLET | ORAL | 0 refills | Status: DC | PRN

## 2017-08-31 MED ORDER — SULFAMETHOXAZOLE-TRIMETHOPRIM 800-160 MG PO TABS: 1.0000 | ORAL_TABLET | Freq: Two times a day (BID) | ORAL | 0 refills | Status: AC

## 2017-08-31 NOTE — Clinical Social Work Note (Signed)
Clinical Social Work Assessment  Patient Details  Name: Barry Wheeler MRN: [id number] Date of Birth: [birth year]  Date of referral:  08/31/17               Reason for consult:  [employer], Substance Use/ETOH Abuse                Permission sought to share information with:    Permission granted to share information::     Name::        Agency::     Relationship::     Contact Information:     Housing/Transportation Living arrangements for the past 2 months:  Single Family Home Source of Information:  Patient Patient Interpreter Needed:  None Criminal Activity/Legal Involvement Pertinent to Current Situation/Hospitalization:  No - Comment as needed Significant Relationships:  Parents Lives with:  Parents Do you feel safe going back to the place where you live?  Yes Need for family participation in patient care:  Yes (Comment)  Care giving concerns:  Patient is living with his mother Stanton Kidney in [place].    Social Worker assessment / plan:  Holiday representative ([employer]) received verbal consult from RN in progression rounds this morning that patient is interested in substance abuse treatment options. CSW met with patient and his mother Stanton Kidney was at bedside. Patient was alert and oriented X4 and was sitting up in the bed. CSW introduced self and explained role of CSW department. Per patient he lives with his mother in [place] and would like for her to stay during assessment. Patient reported that he was released from prison 6 months ago for heroin use. Patient reported that he has not used since he has been out of prison. Patient reported that his wife passed away in 01-Nov-2008 and he stated using cocaine at that point. Patient reported that he was working in [employer] and fell off a ladder and was prescribed percocet after the injury and became addicted. Patient reported that he is working in [employer] right now part time. Patient reported that he does not have a car and relies on  other people for rides. Patient reported that he also has no health insurance. CSW provided patient with a list of [place] resources including [facility] and the [employer] bus. CSW provided emotional support and explained [facility] services. Patient gladly accepted resources and stated that he would follow up with [facility]. CSW will continue to follow and assist as needed.   Employment status:  Systems developer information:  Self Pay (Medicaid Pending) PT Recommendations:  Not assessed at this time Information / Referral to community resources:  Outpatient Substance Abuse Treatment Options  Patient/Family's Response to care:  Patient accepted substance abuse resources.   Patient/Family's Understanding of and Emotional Response to Diagnosis, Current Treatment, and Prognosis:  Patient was pleasant and thanked CSW for visit.   Emotional Assessment Appearance:  Appears stated age Attitude/Demeanor/Rapport:    Affect (typically observed):  Accepting, Adaptable, Pleasant Orientation:  Oriented to Self, Oriented to Place, Oriented to  Time, Oriented to Situation Alcohol / Substance use:  Illicit Drugs Psych involvement (Current and /or in the community):  No (Comment)  Discharge Needs  Concerns to be addressed:  Discharge Planning Concerns Readmission within the last 30 days:  No Current discharge risk:  Substance Abuse Barriers to Discharge:  Continued Medical Work up   [employer], Veronia Beets, LCSW 08/31/2017, 11:06 AM

## 2017-08-31 NOTE — Discharge Summary (Signed)
[facility] [place] at [facility]   PATIENT NAME: Barry Wheeler    MR#:  [id number]  DATE OF BIRTH:  04-Mar-1971  DATE OF ADMISSION:  08/29/2017 ADMITTING PHYSICIAN: Enid Baasadhika Kalisetti, MD  DATE OF DISCHARGE: 08/31/2017  1:57 PM  PRIMARY CARE PHYSICIAN: Patient, No Pcp Per    ADMISSION DIAGNOSIS:  Hyponatremia [E87.1] Abscess of left foot [L02.612] Cellulitis of left foot [L03.116]  DISCHARGE DIAGNOSIS:  Active Problems:   Cellulitis   SECONDARY DIAGNOSIS:   Past Medical History:  Diagnosis Date  . Kidney stones     HOSPITAL COURSE:   ClydeMartinis a47 y.o.malewith a known history of smoking, cocaine use and no other significant medical problems presents to hospital secondary to worsening swellingandpain of his left foot.  1. Left foot cellulitis with abscess-status post I&D in the emergency room.  -Patient was admitted and started on broad-spectrum IV antibiotics with vancomycin, Zosyn. Patient's x-ray showed noted of osteomyelitis. Clinically patient has improved after IV antibiotics and is currently afebrile, with a normal white cell count. His leg looks less swollen red. He's being discharged on 10 more days of additional Bactrim. -He was discharged with home health nursing services to help with dressing changes.  2. Elevated LFTs-no history of alcohol use.  -LFTs improved since yesterday and currently stable.   3. 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 Has tingling numbness and peripheral neuropathy symptoms of both his hands. 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 - cont. Follow up with outpatient physician.    DISCHARGE CONDITIONS:   Stable.   CONSULTS OBTAINED:    DRUG ALLERGIES:  No Known Allergies  DISCHARGE MEDICATIONS:   Allergies as of 08/31/2017   No Known Allergies     Medication List    STOP taking these medications   benzonatate 100 MG capsule Commonly known as:  TESSALON PERLES    predniSONE 10 MG tablet Commonly known as:  DELTASONE     TAKE these medications   oxyCODONE 5 MG immediate release tablet Commonly known as:  Oxy IR/ROXICODONE Take 1 tablet (5 mg total) by mouth every 4 (four) hours as needed for moderate pain. Notes to patient:  Last dose given at 1pm   sulfamethoxazole-trimethoprim 800-160 MG tablet Commonly known as:  BACTRIM DS,SEPTRA DS Take 1 tablet by mouth 2 (two) times daily for 10 days.         DISCHARGE INSTRUCTIONS:   DIET:  Regular diet  DISCHARGE CONDITION:  Stable  ACTIVITY:  Activity as tolerated  OXYGEN:  Home Oxygen: No.   Oxygen Delivery: room air  DISCHARGE LOCATION:  home   If you experience worsening of your admission symptoms, develop shortness of breath, life threatening emergency, suicidal or homicidal thoughts you must seek medical attention immediately by calling 911 or calling your MD immediately  if symptoms less severe.  You Must read complete instructions/literature along with all the possible adverse reactions/side effects for all the Medicines you take and that have been prescribed to you. Take any new Medicines after you have completely understood and accpet all the possible adverse reactions/side effects.   Please note  You were cared for by a hospitalist during your hospital stay. If you have any questions about your discharge medications or the care you received while you were in the hospital after you are discharged, you can call the unit and asked to speak with the hospitalist on call if the hospitalist that took care of you is not available. Once you  are discharged, your primary care physician will handle any further medical issues. Please note that NO REFILLS for any discharge medications will be authorized once you are discharged, as it is imperative that you return to your primary care physician (or establish a relationship with a primary care physician if you do not have one) for your  aftercare needs so that they can reassess your need for medications and monitor your lab values.     Today   Still having significant pain in the left foot but swelling, redness has improved. Afebrile, WBC count normal. Will d/c home today on Oral abx.   VITAL SIGNS:  Blood pressure 118/64, pulse 63, temperature 98.2 F (36.8 C), temperature source Oral, resp. rate 17, height 5\' 7"  (1.702 m), weight 73.9 kg (162 lb 14.4 oz), SpO2 99 %.  I/O:    Intake/Output Summary (Last 24 hours) at 08/31/2017 1515 Last data filed at 08/31/2017 0900 Gross per 24 hour  Intake 240 ml  Output -  Net 240 ml    PHYSICAL EXAMINATION:    GENERAL:  47 y.o.-year-old patient lying in bed in no acute distress.  EYES: Pupils equal, round, reactive to light and accommodation. No scleral icterus. Extraocular muscles intact.  HEENT: Head atraumatic, normocephalic. Oropharynx and nasopharynx clear.  NECK:  Supple, no jugular venous distention. No thyroid enlargement, no tenderness.  LUNGS: Normal breath sounds bilaterally, no wheezing, rales, rhonchi. No use of accessory muscles of respiration.  CARDIOVASCULAR: S1, S2 normal. No murmurs, rubs, or gallops.  ABDOMEN: Soft, nontender, nondistended. Bowel sounds present. No organomegaly or mass.  EXTREMITIES: No cyanosis, clubbing or edema b/l.    NEUROLOGIC: Cranial nerves II through XII are intact. No focal Motor or sensory deficits b/l.   PSYCHIATRIC: The patient is alert and oriented x 3.  SKIN: No obvious rash, lesion, or ulcer. Left foot redness swelling on the dorsum part of the foot much improved since admission secondary to cellulitis/abscess.     DATA REVIEW:   CBC Recent Labs  Lab 08/30/17 0322  WBC 7.0  HGB 14.6  HCT 43.9  PLT 257    Chemistries  Recent Labs  Lab 08/30/17 0322  NA 137  K 3.8  CL 103  CO2 26  GLUCOSE 110*  BUN 11  CREATININE 0.77  CALCIUM 8.2*  AST 54*  ALT 90*  ALKPHOS 53  BILITOT 0.5    Cardiac  Enzymes No results for input(s): TROPONINI in the last 168 hours.  Microbiology Results  Results for orders placed or performed during the hospital encounter of 08/29/17  Blood culture (routine x 2)     Status: None (Preliminary result)   Collection Time: 08/29/17  5:26 PM  Result Value Ref Range Status   Specimen Description BLOOD BLOOD LEFT ARM  Final   Special Requests   Final    BOTTLES DRAWN AEROBIC AND ANAEROBIC Blood Culture results may not be optimal due to an excessive volume of blood received in culture bottles   Culture   Final    NO GROWTH 2 DAYS Performed at [facility], [address] [place], [place] [address]    Report Status PENDING  Incomplete  Blood culture (routine x 2)     Status: None (Preliminary result)   Collection Time: 08/29/17  5:27 PM  Result Value Ref Range Status   Specimen Description BLOOD LEFT ANTECUBITAL  Final   Special Requests   Final    BOTTLES DRAWN AEROBIC AND ANAEROBIC Blood Culture results  may not be optimal due to an excessive volume of blood received in culture bottles   Culture   Final    NO GROWTH 2 DAYS Performed at [facility] [place] [facility], [address] [place], [place] [address]    Report Status PENDING  Incomplete  Wound or Superficial Culture     Status: None (Preliminary result)   Collection Time: 08/29/17  6:00 PM  Result Value Ref Range Status   Specimen Description   Final    WOUND LEFT FOOT Performed at [facility], [address], [place], [place] [address]    Special Requests   Final    NONE Performed at [facility], [address] [place], [place] [address]    Gram Stain   Final    RARE WBC PRESENT, PREDOMINANTLY PMN RARE GRAM POSITIVE COCCI IN PAIRS IN CHAINS    Culture   Final    CULTURE REINCUBATED FOR BETTER GROWTH Performed at [facility], [address] [place], [place] [address]    Report Status PENDING  Incomplete    RADIOLOGY:  Dg Foot Complete  Left  Result Date: 08/29/2017 CLINICAL DATA:  Left foot pain and redness for 5 days. EXAM: LEFT FOOT - COMPLETE 3+ VIEW COMPARISON:  None. FINDINGS: There is no evidence of fracture or dislocation. There is no evidence of arthropathy or other focal bone abnormality. No radiopaque foreign body is identified. IMPRESSION: No acute abnormality. Electronically Signed   By: Sherian Rein M.D.   On: 08/29/2017 16:18      Management plans discussed with the patient, family and they are in agreement.  CODE STATUS:     Code Status Orders  (From admission, onward)        Start     Ordered   08/29/17 2040  Full code  Continuous     08/29/17 2039    Code Status History    Date Active Date Inactive Code Status Order ID Comments User Context   This patient has a current code status but no historical code status.      TOTAL TIME TAKING CARE OF THIS PATIENT: 40 minutes.    Houston Siren M.D on 08/31/2017 at 3:15 PM  Between 7am to 6pm - Pager - [phone]  After 6pm go to [email or web address] - Social research officer, government  [facility] [place] [facility]  Office  [phone]  CC: Primary care physician; Patient, No Pcp Per

## 2017-08-31 NOTE — Discharge Instructions (Signed)
Take antibiotics until gone even if you begin to feel better

## 2017-08-31 NOTE — Progress Notes (Signed)
Discharge instructions and prescriptions given to patient and mother with verbalization of understanding. Patient was given instructions and demonstration on how to change dressing to left foot.

## 2017-09-01 LAB — AEROBIC CULTURE W GRAM STAIN (SUPERFICIAL SPECIMEN)

## 2017-09-01 LAB — AEROBIC CULTURE  (SUPERFICIAL SPECIMEN)

## 2017-09-03 ENCOUNTER — Emergency Department
Admission: EM | Admit: 2017-09-03 | Discharge: 2017-09-03 | Disposition: A | Discharge status: Home / Self Care | Payer: Self-pay | Attending: Emergency Medicine | Admitting: Emergency Medicine

## 2017-09-03 DIAGNOSIS — L03116 Cellulitis of left lower limb: Secondary | ICD-10-CM | POA: Insufficient documentation

## 2017-09-03 DIAGNOSIS — F17210 Nicotine dependence, cigarettes, uncomplicated: Secondary | ICD-10-CM | POA: Insufficient documentation

## 2017-09-03 DIAGNOSIS — Z5189 Encounter for other specified aftercare: Secondary | ICD-10-CM | POA: Insufficient documentation

## 2017-09-03 LAB — CULTURE, BLOOD (ROUTINE X 2)
Culture: NO GROWTH
Culture: NO GROWTH

## 2017-09-03 MED ORDER — BACITRACIN ZINC 500 UNIT/GM EX OINT
TOPICAL_OINTMENT | Freq: Once | CUTANEOUS | Status: DC
  Filled 2017-09-03: qty 28.35

## 2017-09-03 MED ORDER — MUPIROCIN 2 % EX OINT: TOPICAL_OINTMENT | CUTANEOUS | 0 refills | Status: DC

## 2017-09-03 MED ORDER — ACETAMINOPHEN 500 MG PO TABS
1000.0000 mg | ORAL_TABLET | Freq: Once | ORAL | Status: AC
  Administered 2017-09-03: 1000 mg via ORAL
  Filled 2017-09-03: qty 2

## 2017-09-03 NOTE — ED Provider Notes (Signed)
[facility] Emergency Department Provider Note       Time seen: ----------------------------------------- 5:32 PM on 09/03/2017 -----------------------------------------   I have reviewed the triage vital signs and the nursing notes.  HISTORY   Chief Complaint Medical Clearance and Foot Injury    HPI Barry Wheeler is a 47 y.o. male with a history of renal colic who presents to the ED for medical clearance for jail.  Patient [place] from the police department in custody.  He was here on the 15th for similar.  Patient states he just started antibiotics yesterday and he had an abscess incised and drained on his left foot that currently has packing in it.  Patient complains of pain in the left foot.  Past Medical History:  Diagnosis Date  . Kidney stones     Patient Active Problem List   Diagnosis Date Noted  . Cellulitis 08/29/2017    Past Surgical History:  Procedure Laterality Date  . WRIST SURGERY      Allergies Patient has no known allergies.  Social History Social History   Tobacco Use  . Smoking status: Current Every Day Smoker    Packs/day: 0.50    Types: Cigarettes  . Smokeless tobacco: Never Used  Substance Use Topics  . Alcohol use: No  . Drug use: Yes    Frequency: 2.0 times per week    Types: Cocaine    Review of Systems Constitutional: Negative for fever. Cardiovascular: Negative for chest pain. Respiratory: Negative for shortness of breath. Gastrointestinal: Negative for abdominal pain, vomiting and diarrhea. Genitourinary: Negative for dysuria. Musculoskeletal: Positive for left foot pain Skin: Positive for left foot erythema Neurological: Negative for headaches, focal weakness or numbness.  All systems negative/normal/unremarkable except as stated in the HPI  ____________________________________________   PHYSICAL EXAM:  VITAL SIGNS: ED Triage Vitals [09/03/17 1713]  Enc Vitals Group     BP (!) 141/83      Pulse Rate 62     Resp 18     Temp 98.2 F (36.8 C)     Temp Source Oral     SpO2 97 %     Weight 162 lb (73.5 kg)     Height      Head Circumference      Peak Flow      Pain Score 9     Pain Loc      Pain Edu?      Excl. in GC?    Constitutional: Alert and oriented. Well appearing and in no distress. Eyes: Conjunctivae are normal. Normal extraocular movements. Cardiovascular: Normal rate, regular rhythm. No murmurs, rubs, or gallops. Respiratory: Normal respiratory effort without tachypnea nor retractions. Breath sounds are clear and equal bilaterally. No wheezes/rales/rhonchi. Gastrointestinal: Soft and nontender. Normal bowel sounds Musculoskeletal: Tenderness across the dorsum of the left foot with some mild erythema Neurologic:  Normal speech and language. No gross focal neurologic deficits are appreciated.  Skin: Mild erythema is noted in the left foot, this does not approach the lines of demarcation as previously made by the doctor performing the I&D.  There is a wound that has been incised on the dorsum of the left foot.  Packing was removed.  ____________________________________________  ED COURSE:  As part of my medical decision making, I reviewed the following data within the electronic MEDICAL RECORD NUMBER History obtained from family if available, nursing notes, old chart and ekg, as well as notes from prior ED visits. Patient presented for medical clearance for jail.  He was removed from the left foot wound.  Overall he is stable for outpatient follow-up.   Procedures ___________________________________________  DIFFERENTIAL DIAGNOSIS   Wound recheck, cellulitis, abscess  FINAL ASSESSMENT AND PLAN  Medical screening exam, wound check   Plan: Patient had presented for wound check prior to jail.  Patient is encouraged to continue Septra as prescribed.  I will prescribe some Bactroban ointment and dressing changes daily in jail.   Emily Filbert,  MD   Note: This note was generated in part or whole with voice recognition software. Voice recognition is usually quite accurate but there are transcription errors that can and very often do occur. I apologize for any typographical errors that were not detected and corrected.     Emily Filbert, MD 09/03/17 [phone]

## 2017-09-03 NOTE — ED Notes (Signed)
Pt cleared. Dressing put on pt left foot. Discharge instructions, RX given officer. All questions answered.

## 2017-09-03 NOTE — ED Triage Notes (Signed)
Pt here with [employer] for clearance for jail.  Pt in custody.  Pt was seen here on the 15th for the same.  Pt states he just started to take abx yesterday and that he has been staying off of it.  Pt in NAD, ambulatory with uneven, but steady gait.

## 2018-01-18 ENCOUNTER — Emergency Department: Payer: Self-pay

## 2018-01-18 ENCOUNTER — Encounter: Payer: Self-pay | Admitting: Emergency Medicine

## 2018-01-18 ENCOUNTER — Emergency Department
Admission: EM | Admit: 2018-01-18 | Discharge: 2018-01-18 | Disposition: A | Discharge status: Home / Self Care | Payer: Self-pay | Attending: Emergency Medicine | Admitting: Emergency Medicine

## 2018-01-18 ENCOUNTER — Other Ambulatory Visit: Payer: Self-pay

## 2018-01-18 DIAGNOSIS — M25521 Pain in right elbow: Secondary | ICD-10-CM | POA: Insufficient documentation

## 2018-01-18 DIAGNOSIS — R079 Chest pain, unspecified: Secondary | ICD-10-CM | POA: Insufficient documentation

## 2018-01-18 DIAGNOSIS — F17210 Nicotine dependence, cigarettes, uncomplicated: Secondary | ICD-10-CM | POA: Insufficient documentation

## 2018-01-18 DIAGNOSIS — T50901A Poisoning by unspecified drugs, medicaments and biological substances, accidental (unintentional), initial encounter: Secondary | ICD-10-CM | POA: Insufficient documentation

## 2018-01-18 HISTORY — DX: Other psychoactive substance abuse, uncomplicated: F19.10

## 2018-01-18 NOTE — ED Notes (Signed)
Attempt for IV access X 2 unsuccessful.  

## 2018-01-18 NOTE — ED Triage Notes (Signed)
Pt arrived via [employer] deputy, states the patient was found not coherent after using heroin. Pt states he ate cotton and that was the last thing that he remembers.   Pt's mother called 9-1-1 in reference to patient in cardiac arrest, per [employer], pt was not in cardiac arrest, did not lose consciousness and did not receive narcan. Per [employer], pt was combative on scene and would not speak or respond to anyone.  Pt after about 5-6 minutes after [employer] arrived, pt was calm and able to communicate.   Pt is calm and cooperative in triage. Pt denies any SI/HI and states he did not intentionally overdose. Pt states he has been in jail in [place] for the past 3 1/2 weeks and had not used since then.  Pt is alert in triage and able to answer questions appropriately.   Pt is diaphoretic in triage and

## 2018-01-18 NOTE — ED Notes (Signed)
ED Provider at bedside. 

## 2018-01-18 NOTE — Progress Notes (Signed)
   01/18/18 1432  Clinical Encounter Type  Visited With Family  Visit Type Other (Comment) (escort family to patient room and to lobby)  Referral From Family   While en route to a call, chaplain encountered patient family, escorted one family member to patient room.  Chaplain then escorted other family member to lobby, used active listening as family member spoke of events leading to patient's hospitalization.

## 2018-01-18 NOTE — Discharge Instructions (Addendum)
please follow-up with the outpatient resources that TTS gave you. Please return here for any further problems. use Advil or Tylenol as needed for aches and pains.

## 2018-01-18 NOTE — ED Notes (Addendum)
TTS counseler at bedside.

## 2018-01-18 NOTE — ED Notes (Signed)
Attempted IV access x 1 - unsuccessful.

## 2018-01-18 NOTE — ED Provider Notes (Addendum)
[facility] Emergency Department Provider Note   ____________________________________________   First MD Initiated Contact with Patient 01/18/18 1419     (approximate)  I have reviewed the triage vital signs and the nursing notes.   HISTORY  Chief Complaint Drug Overdose    HPI Barry Wheeler is a 47 y.o. male Patient reports his been in jail for the last 3 weeks has not used on till today. Today he had some heroin. [facility] was [facility] and got there did not get Narcan else K right elbow at side of his chest.and some bruising on the right side of his head where he fell.patient asked for help getting off of drugs   Past Medical History:  Diagnosis Date  . Drug abuse (HCC)   . Kidney stones     Patient Active Problem List   Diagnosis Date Noted  . Cellulitis 08/29/2017    Past Surgical History:  Procedure Laterality Date  . WRIST SURGERY      Prior to Admission medications   Medication Sig Start Date End Date Taking? Authorizing Provider  mupirocin ointment (BACTROBAN) 2 % Apply to affected area 3 times daily 09/03/17 09/03/18  Emily FilbertWilliams, Jonathan E, MD  oxyCODONE (OXY IR/ROXICODONE) 5 MG immediate release tablet Take 1 tablet (5 mg total) by mouth every 4 (four) hours as needed for moderate pain. 08/31/17   Houston SirenSainani, Vivek J, MD    Allergies Patient has no known allergies.  Family History  Problem Relation Age of Onset  . Hypertension Mother   . CAD Father     Social History Social History   Tobacco Use  . Smoking status: Current Every Day Smoker    Packs/day: 0.50    Types: Cigarettes  . Smokeless tobacco: Never Used  Substance Use Topics  . Alcohol use: No  . Drug use: Yes    Frequency: 2.0 times per week    Types: Cocaine    Comment: Heroin    Review of Systems  Constitutional: No fever/chills Eyes: No visual changes. ENT: No sore throat. Cardiovascular: Denies chest pain. Respiratory: Denies shortness of  breath. Gastrointestinal: No abdominal pain.  No nausea, no vomiting.  No diarrhea.  No constipation. Genitourinary: Negative for dysuria. Musculoskeletal: Negative for back pain. Skin: Negative for rash. Neurological: Negative for focal weakness  ____________________________________________   PHYSICAL EXAM:  VITAL SIGNS: ED Triage Vitals  Enc Vitals Group     BP 01/18/18 1308 (!) 155/109     Pulse Rate 01/18/18 1308 (!) 109     Resp 01/18/18 1308 16     Temp 01/18/18 1308 98.4 F (36.9 C)     Temp Source 01/18/18 1308 Oral     SpO2 01/18/18 1308 93 %     Weight 01/18/18 1316 160 lb (72.6 kg)     Height 01/18/18 1316 5\' 6"  (1.676 m)     Head Circumference --      Peak Flow --      Pain Score 01/18/18 1308 8     Pain Loc --      Pain Edu? --      Excl. in GC? --     Constitutional: Alert and oriented. Well appearing and in no acute distress. Eyes: Conjunctivae are normal.  Head: bruises especially the left side of face Nose: No congestion/rhinnorhea. Mouth/Throat: Mucous membranes are moist.  Oropharynx non-erythematous. Neck: No stridor.  No cervical spine tenderness to palpation. Cardiovascular: Normal rate, regular rhythm. Grossly normal heart sounds.  Good  peripheral circulation. Respiratory: Normal respiratory effort.  No retractions. Lungs CTAB. Gastrointestinal: Soft and nontender. No distention. No abdominal bruits. No CVA tenderness. Musculoskeletal: No lower extremity tenderness nor edemaexcept for right elbow where it is tender although there is no bruising or swelling. Neurologic:  Normal speech and language. No gross focal neurologic deficits are appreciated.  Skin:  Skin is warm, dry and intact. No rash noted.bruising as noted above Psychiatric: Mood and affect are normal. Speech and behavior are normal.  ____________________________________________   LABS (all labs ordered are listed, but only abnormal results are displayed)  Labs Reviewed - No data to  display ____________________________________________  EKG  EKG read and interpreted by me shows normal sinus rhythm rate 89 normal axis no acute ST-T changes ____________________________________________  RADIOLOGY  ED MD interpretation:CT of the head shows no acute disease needed. The chest x-ray  Official radiology report(s): Dg Chest 2 View  Result Date: 01/18/2018 CLINICAL DATA:  Acute chest pain.  Fall. EXAM: CHEST - 2 VIEW COMPARISON:  05/25/2017 and prior radiographs FINDINGS: The cardiomediastinal silhouette is unremarkable. There is no evidence of focal airspace disease, pulmonary edema, suspicious pulmonary nodule/mass, pleural effusion, or pneumothorax. No acute bony abnormalities are identified. IMPRESSION: No active cardiopulmonary disease. Electronically Signed   By: Harmon Pier M.D.   On: 01/18/2018 14:58   Dg Elbow Complete Right  Result Date: 01/18/2018 CLINICAL DATA:  Acute RIGHT elbow pain following fall. Initial encounter. EXAM: RIGHT ELBOW - COMPLETE 3+ VIEW COMPARISON:  None. FINDINGS: There is no evidence of fracture, dislocation, or joint effusion. There is no evidence of arthropathy or other focal bone abnormality. Soft tissues are unremarkable. IMPRESSION: Negative. Electronically Signed   By: Harmon Pier M.D.   On: 01/18/2018 15:00   Ct Head Wo Contrast  Result Date: 01/18/2018 CLINICAL DATA:  Patient non coherent after using heroin. EXAM: CT HEAD WITHOUT CONTRAST TECHNIQUE: Contiguous axial images were obtained from the base of the skull through the vertex without intravenous contrast. COMPARISON:  None. FINDINGS: Brain: No evidence of acute infarction, hemorrhage, hydrocephalus, extra-axial collection or mass lesion/mass effect. Vascular: Diffusely hyper attenuated appearance of the intracranial vessels, possibly due to dehydration. Skull: Normal. Negative for fracture or focal lesion. Sinuses/Orbits: No acute finding. Other: None. IMPRESSION: Diffusely hyperattenuated  appearance of the intracranial vessels, possibly due to dehydration. Otherwise no acute intracranial abnormality. Electronically Signed   By: Ted Mcalpine M.D.   On: 01/18/2018 15:02    ____________________________________________   PROCEDURES  Procedure(s) performed:   Procedures  Critical Care performed:   ____________________________________________   INITIAL IMPRESSION / ASSESSMENT AND PLAN / ED COURSE  patient wants to try outpatient rehabilitation. He is otherwise doing well. We have provided him with information for outpatient rehabilitation let him go.        ____________________________________________   FINAL CLINICAL IMPRESSION(S) / ED DIAGNOSES  Final diagnoses:  Accidental overdose, initial encounter     ED Discharge Orders    None       Note:  This document was prepared using Dragon voice recognition software and may include unintentional dictation errors.    Arnaldo Natal, MD 01/18/18 1544    Arnaldo Natal, MD 01/30/18 [phone]

## 2018-01-18 NOTE — ED Notes (Signed)
Pt alert and oriented X4, active, cooperative, pt in NAD. RR even and unlabored, color WNL.  Pt informed to return if any life threatening symptoms occur.  Discharge and followup instructions reviewed.  

## 2018-01-18 NOTE — BH Assessment (Signed)
Assessment Note  Barry Wheeler is an 47 y.o. male. Patient presents to [facility]ED due heroin use. Patient reports he uses about a gram of heroin daily. Patient reports he is depressed due to pending larceny charges and the passing of his wife and father. [facility] due to larceny. Patient denies SI, HI, and AVH. Patient states he is seeking outpatient substance abuse treatment.   Patient has court dates for 01/2018 and 02/2018 for felony larceny, resisting arrest, and trespassing.   Patient denies previous substance abuse and or mental health treatment.   Patient presented oriented x 4, occasionally dozed off during assessment.   Diagnosis: Depression  Past Medical History:  Past Medical History:  Diagnosis Date  . Drug abuse (HCC)   . Kidney stones     Past Surgical History:  Procedure Laterality Date  . WRIST SURGERY      Family History:  Family History  Problem Relation Age of Onset  . Hypertension Mother   . CAD Father     Social History:  reports that he has been smoking cigarettes.  He has been smoking about 0.50 packs per day. He has never used smokeless tobacco. He reports that he has current or past drug history. Drug: Cocaine. Frequency: 2.00 times per week. He reports that he does not drink alcohol.  Additional Social History:  Alcohol / Drug Use Pain Medications: SEE PTA  Prescriptions: SEE PTA  Over the Counter: SEE PTA  History of alcohol / drug use?: Yes Longest period of sobriety (when/how long): 3.5 weeks  Negative Consequences of Use: Financial, Legal, Personal relationships, Work / School Withdrawal Symptoms: Agitation Substance #1 Name of Substance 1: Heroin 1 - Age of First Use: 32  1 - Amount (size/oz): a gram  1 - Frequency: daily  1 - Duration: unknown 1 - Last Use / Amount: today (01/18/2018)  CIWA: CIWA-Ar BP: 131/77 Pulse Rate: 76 COWS:    Allergies: No Known Allergies  Home  Medications:  (Not in a hospital admission)  OB/GYN Status:  No LMP for male patient.  General Assessment Data Assessment unable to be completed: (Assessment completed) [facility] Assessment: In system Is this a Tele or Face-to-Face Assessment?: Face-to-Face Is this an Initial Assessment or a Re-assessment for this encounter?: Initial Assessment Marital status: Single Maiden name: N/A Is patient pregnant?: No Pregnancy Status: No Living Arrangements: Parent Can pt return to current living arrangement?: Yes Admission Status: Voluntary Is patient capable of signing voluntary admission?: Yes Referral Source: Self/Family/Friend Insurance type: No Engineer, civil (consulting)insurance  Medical Screening Exam [facility] ONLY) Medical Exam completed: Yes  Crisis Care Plan Living Arrangements: Parent Legal Guardian: Other:(None reported) Name of Psychiatrist: None reported Name of Therapist: None reported  Education Status Is patient currently in school?: No Is the patient employed, unemployed or receiving disability?: Unemployed  Risk to self with the past 6 months Suicidal Ideation: No Has patient been a risk to self within the past 6 months prior to admission? : No Suicidal Intent: No Has patient had any suicidal intent within the past 6 months prior to admission? : No Is patient at risk for suicide?: No Suicidal Plan?: No Has patient had any suicidal plan within the past 6 months prior to admission? : No Access to Means: No What has been your use of drugs/alcohol within the last 12 months?: Heroin Previous Attempts/Gestures: No How many times?: 0 Other Self Harm  Risks: None reported Triggers for Past Attempts: Other (Comment)(None reported) Intentional Self Injurious Behavior: None Family Suicide History: No Recent stressful life event(s): Legal Issues Persecutory voices/beliefs?: No Depression: Yes Depression Symptoms: Feeling worthless/self pity Substance abuse  history and/or treatment for substance abuse?: Yes Suicide prevention information given to non-admitted patients: Not applicable  Risk to Others within the past 6 months Homicidal Ideation: No Does patient have any lifetime risk of violence toward others beyond the six months prior to admission? : No Thoughts of Harm to Others: No Current Homicidal Intent: No Current Homicidal Plan: No Access to Homicidal Means: No Identified Victim: None reported History of harm to others?: No Assessment of Violence: None Noted Violent Behavior Description: None reported Does patient have access to weapons?: No Criminal Charges Pending?: Yes Describe Pending Criminal Charges: Pension scheme manager, felony larceny, 2nd degree trespassing Does patient have a court date: Yes Court Date: 01/26/18(02/07/2018, 02/25/2018) Is patient on probation?: No  Psychosis Hallucinations: None noted Delusions: None noted  Mental Status Report Appearance/Hygiene: Disheveled Eye Contact: Fair Motor Activity: Unremarkable Speech: Pressured Level of Consciousness: Drowsy, Irritable Mood: Depressed, Irritable Affect: Irritable, Depressed Anxiety Level: None Thought Processes: Circumstantial Judgement: Impaired Orientation: Person, Place, Time, Appropriate for developmental age, Situation Obsessive Compulsive Thoughts/Behaviors: None  Cognitive Functioning Concentration: Fair Memory: Recent Intact, Remote Intact Is patient IDD: No Is patient DD?: No Insight: Poor Impulse Control: Poor Appetite: Poor Have you had any weight changes? : No Change Sleep: Decreased Total Hours of Sleep: 2 Vegetative Symptoms: None  ADLScreening [facility] Assessment Services) Patient's cognitive ability adequate to safely complete daily activities?: Yes Patient able to express need for assistance with ADLs?: Yes Independently performs ADLs?: Yes (appropriate for developmental age)  Prior Inpatient Therapy Prior Inpatient  Therapy: No  Prior Outpatient Therapy Prior Outpatient Therapy: No Does patient have an ACCT team?: No Does patient have Intensive In-House Services?  : No Does patient have [facility] services? : No Does patient have P4CC services?: No  ADL Screening (condition at time of admission) Patient's cognitive ability adequate to safely complete daily activities?: Yes Is the patient deaf or have difficulty hearing?: No Does the patient have difficulty seeing, even when wearing glasses/contacts?: No Does the patient have difficulty concentrating, remembering, or making decisions?: No Patient able to express need for assistance with ADLs?: Yes Does the patient have difficulty dressing or bathing?: No Independently performs ADLs?: Yes (appropriate for developmental age) Does the patient have difficulty walking or climbing stairs?: No Weakness of Legs: None  Home Assistive Devices/Equipment Home Assistive Devices/Equipment: None  Therapy Consults (therapy consults require a physician order) PT Evaluation Needed: No OT Evalulation Needed: No SLP Evaluation Needed: No Abuse/Neglect Assessment (Assessment to be complete while patient is alone) Abuse/Neglect Assessment Can Be Completed: Yes Physical Abuse: Denies Verbal Abuse: Denies Sexual Abuse: Denies Exploitation of patient/patient's resources: Denies Self-Neglect: Denies Values / Beliefs Cultural Requests During Hospitalization: None Spiritual Requests During Hospitalization: None Consults Spiritual Care Consult Needed: No Social Work Consult Needed: No Merchant navy officer (For Healthcare) Does Patient Have a Medical Advance Directive?: No          Disposition:  Disposition Initial Assessment Completed for this Encounter: Yes Patient referred to: Outpatient clinic referral  On Site Evaluation by:   Reviewed with Physician:    Galen Manila, LPC, LCAS-A 01/18/2018 4:05 PM

## 2018-01-18 NOTE — ED Notes (Signed)
Per deputy's report, patient chewed a cotton swab that had heroin on it today. Patient's mother called because he was unsteady on his feet and fell into the door. Patient never lost consciousness, but has had bouts of vomiting since ingestion. Patient states he is here for drug rehab.

## 2018-01-18 NOTE — ED Notes (Signed)
Multiple attempts made for IV access unsuccessful.  Pt also has abrasion to left check with dried up blood.

## 2018-02-03 ENCOUNTER — Encounter (HOSPITAL_COMMUNITY): Payer: Self-pay

## 2018-02-03 ENCOUNTER — Emergency Department (HOSPITAL_COMMUNITY)

## 2018-02-03 ENCOUNTER — Other Ambulatory Visit: Payer: Self-pay

## 2018-02-03 ENCOUNTER — Emergency Department (HOSPITAL_COMMUNITY)
Admission: EM | Admit: 2018-02-03 | Discharge: 2018-02-04 | Disposition: A | Discharge status: Home / Self Care | Attending: Emergency Medicine | Admitting: Emergency Medicine

## 2018-02-03 DIAGNOSIS — Y929 Unspecified place or not applicable: Secondary | ICD-10-CM | POA: Insufficient documentation

## 2018-02-03 DIAGNOSIS — Y939 Activity, unspecified: Secondary | ICD-10-CM | POA: Insufficient documentation

## 2018-02-03 DIAGNOSIS — S2241XA Multiple fractures of ribs, right side, initial encounter for closed fracture: Secondary | ICD-10-CM

## 2018-02-03 DIAGNOSIS — Y999 Unspecified external cause status: Secondary | ICD-10-CM | POA: Insufficient documentation

## 2018-02-03 DIAGNOSIS — F17210 Nicotine dependence, cigarettes, uncomplicated: Secondary | ICD-10-CM | POA: Insufficient documentation

## 2018-02-03 DIAGNOSIS — X58XXXA Exposure to other specified factors, initial encounter: Secondary | ICD-10-CM | POA: Insufficient documentation

## 2018-02-03 MED ORDER — OXYCODONE HCL 5 MG PO TABS
5.0000 mg | ORAL_TABLET | Freq: Once | ORAL | Status: AC
  Administered 2018-02-03: 5 mg via ORAL
  Filled 2018-02-03: qty 1

## 2018-02-03 MED ORDER — IBUPROFEN 800 MG PO TABS
800.0000 mg | ORAL_TABLET | Freq: Once | ORAL | Status: AC
  Administered 2018-02-03: 800 mg via ORAL
  Filled 2018-02-03: qty 1

## 2018-02-03 MED ORDER — MORPHINE SULFATE 15 MG PO TABS: 15.0000 mg | ORAL_TABLET | ORAL | 0 refills | Status: DC | PRN

## 2018-02-03 MED ORDER — DOXYCYCLINE HYCLATE 100 MG PO TABS
200.0000 mg | ORAL_TABLET | Freq: Once | ORAL | Status: AC
  Administered 2018-02-03: 200 mg via ORAL
  Filled 2018-02-03: qty 2

## 2018-02-03 MED ORDER — ACETAMINOPHEN 500 MG PO TABS
1000.0000 mg | ORAL_TABLET | Freq: Once | ORAL | Status: AC
  Administered 2018-02-03: 1000 mg via ORAL
  Filled 2018-02-03: qty 2

## 2018-02-03 NOTE — ED Provider Notes (Signed)
[facility]-EMERGENCY DEPT Provider Note   CSN: [id number] Arrival date & time: 02/03/18  2158     History   Chief Complaint No chief complaint on file.   HPI Barry Wheeler is a 47 y.o. male.  47 yo M with a chief complaint of right-sided chest wall pain.  Going on for the past 3 weeks.  The patient fell at some points when it started but was unsure if that was before or after his symptoms.  Worse with movement palpation deep breathing coughing and sneezing.  Denies fevers or chills denies shortness of breath.  Denies hemoptysis lower extremity edema history of PE or DVT testosterone use recent surgery or immobilization.  The history is provided by the patient.  Chest Pain   This is a new problem. The current episode started more than 1 week ago. The problem occurs constantly. The problem has not changed since onset.The pain is associated with movement, coughing and breathing. The pain is present in the lateral region. The pain is at a severity of 7/10. The pain is moderate. The quality of the pain is described as sharp. The pain does not radiate. Duration of episode(s) is 3 weeks. Pertinent negatives include no abdominal pain, no fever, no headaches, no palpitations, no shortness of breath and no vomiting. He has tried nothing for the symptoms. The treatment provided no relief.    Past Medical History:  Diagnosis Date  . Drug abuse (HCC)   . Kidney stones     Patient Active Problem List   Diagnosis Date Noted  . Cellulitis 08/29/2017    Past Surgical History:  Procedure Laterality Date  . WRIST SURGERY          Home Medications    Prior to Admission medications   Medication Sig Start Date End Date Taking? Authorizing Provider  morphine (MSIR) 15 MG tablet Take 1 tablet (15 mg total) by mouth every 4 (four) hours as needed for severe pain. 02/03/18   Melene Plan, DO  mupirocin ointment Idelle Jo) 2 % Apply to affected area 3 times daily 09/03/17  09/03/18  Emily Filbert, MD  oxyCODONE (OXY IR/ROXICODONE) 5 MG immediate release tablet Take 1 tablet (5 mg total) by mouth every 4 (four) hours as needed for moderate pain. 08/31/17   Houston Siren, MD    Family History Family History  Problem Relation Age of Onset  . Hypertension Mother   . CAD Father     Social History Social History   Tobacco Use  . Smoking status: Current Every Day Smoker    Packs/day: 0.50    Types: Cigarettes  . Smokeless tobacco: Never Used  Substance Use Topics  . Alcohol use: No  . Drug use: Yes    Frequency: 2.0 times per week    Types: Cocaine    Comment: Heroin     Allergies   Patient has no known allergies.   Review of Systems Review of Systems  Constitutional: Negative for chills and fever.  HENT: Negative for congestion and facial swelling.   Eyes: Negative for discharge and visual disturbance.  Respiratory: Negative for shortness of breath.   Cardiovascular: Positive for chest pain. Negative for palpitations.  Gastrointestinal: Negative for abdominal pain, diarrhea and vomiting.  Musculoskeletal: Positive for arthralgias and myalgias.  Skin: Negative for color change and rash.  Neurological: Negative for tremors, syncope and headaches.  Psychiatric/Behavioral: Negative for confusion and dysphoric mood.     Physical Exam Updated Vital Signs  BP 110/73   Pulse 66   Temp (!) 97.4 F (36.3 C) (Oral)   Resp 18   SpO2 99%   Physical Exam  Constitutional: He is oriented to person, place, and time. He appears well-developed and well-nourished.  HENT:  Head: Normocephalic and atraumatic.  Eyes: Pupils are equal, round, and reactive to light. EOM are normal.  Neck: Normal range of motion. Neck supple. No JVD present.  Cardiovascular: Normal rate and regular rhythm. Exam reveals no gallop and no friction rub.  No murmur heard. Pulmonary/Chest: No respiratory distress. He has no wheezes.  Abdominal: He exhibits no  distension and no mass. There is tenderness. There is no rebound and no guarding.  Musculoskeletal: Normal range of motion. He exhibits tenderness.  Tenderness about the right anterior chest wall.  Some tenderness is well up under the ribs but worse over the actual ribs 4 through 6 about the lateral clavicular line.  No rash.  Neurological: He is alert and oriented to person, place, and time.  Skin: No rash noted. No pallor.  Psychiatric: He has a normal mood and affect. His behavior is normal.  Nursing note and vitals reviewed.    ED Treatments / Results  Labs (all labs ordered are listed, but only abnormal results are displayed) Labs Reviewed - No data to display  EKG None  Radiology Dg Ribs Unilateral W/chest Right  Result Date: 02/03/2018 CLINICAL DATA:  47 y/o M; fall 3 weeks ago with right-sided persistent chest pain. EXAM: RIGHT RIBS AND CHEST - 3+ VIEW COMPARISON:  01/18/2018 chest radiograph FINDINGS: Acute mildly displaced fractures of the right anterolateral fifth and sixth ribs. No pneumothorax. Normal cardiac silhouette. Clear lungs. IMPRESSION: Acute mildly displaced fractures of right anterolateral fifth and sixth ribs. Electronically Signed   By: Mitzi Hansen M.D.   On: 02/03/2018 23:43    Procedures Procedures (including critical care time)  Medications Ordered in ED Medications  acetaminophen (TYLENOL) tablet 1,000 mg (1,000 mg Oral Given 02/03/18 2350)  ibuprofen (ADVIL,MOTRIN) tablet 800 mg (800 mg Oral Given 02/03/18 2350)  oxyCODONE (Oxy IR/ROXICODONE) immediate release tablet 5 mg (5 mg Oral Given 02/03/18 2350)  doxycycline (VIBRA-TABS) tablet 200 mg (200 mg Oral Given 02/03/18 2349)     Initial Impression / Assessment and Plan / ED Course  I have reviewed the triage vital signs and the nursing notes.  Pertinent labs & imaging results that were available during my care of the patient were reviewed by me and considered in my medical decision  making (see chart for details).     46 with a chief complaint of right-sided chest wall pain.  Likely musculoskeletal in nature. PERC negative.  He recent had 3 engorged ticks.  Will give 1 dose of doxycycline.  The patient has 2 acute rib fractures on the right.  Will treat as such.  Incentive spirometry discharge home.  1:16 AM:  I have discussed the diagnosis/risks/treatment options with the patient and believe the pt to be eligible for discharge home to follow-up with PCP. We also discussed returning to the ED immediately if new or worsening sx occur. We discussed the sx which are most concerning (e.g., sudden worsening pain, fever, inability to tolerate by mouth) that necessitate immediate return. Medications administered to the patient during their visit and any new prescriptions provided to the patient are listed below.  Medications given during this visit Medications  acetaminophen (TYLENOL) tablet 1,000 mg (1,000 mg Oral Given 02/03/18 2350)  ibuprofen (ADVIL,MOTRIN) tablet 800  mg (800 mg Oral Given 02/03/18 2350)  oxyCODONE (Oxy IR/ROXICODONE) immediate release tablet 5 mg (5 mg Oral Given 02/03/18 2350)  doxycycline (VIBRA-TABS) tablet 200 mg (200 mg Oral Given 02/03/18 2349)    Images reviewed 2 right sided rib fractures without focal infiltrate as viewed by me.    The patient appears reasonably screen and/or stabilized for discharge and I doubt any other medical condition or other [facility] requiring further screening, evaluation, or treatment in the ED at this time prior to discharge.    Final Clinical Impressions(s) / ED Diagnoses   Final diagnoses:  Closed fracture of multiple ribs of right side, initial encounter    ED Discharge Orders        Ordered    morphine (MSIR) 15 MG tablet  Every 4 hours PRN     02/03/18 2351       Melene PlanFloyd, Obrian Bulson, DO 02/04/18 0116

## 2018-02-03 NOTE — Discharge Instructions (Addendum)
Take 4 over the counter ibuprofen tablets 3 times a day or 2 over-the-counter naproxen tablets twice a day for pain. Also take tylenol 1000mg (2 extra strength) four times a day.   Then take the pain medicine if you feel like you need it. Narcotics do not help with the pain, they only make you care about it less.  You can become addicted to this, people may break into your house to steal it.  It will constipate you.  If you drive under the influence of this medicine you can get a DUI.    You need to continue to take deep breaths.  That with the medicine is for.  He also can use a pillow and hold it against your chest wall to take a deeper breath.  Return for fever or worsening trouble breathing.

## 2018-02-03 NOTE — ED Triage Notes (Signed)
Pt reports R lower rib pain that is worse when coughing, deep breathing, or sneezing. Denies injury or chest pain. A&Ox4. Ambulatory.

## 2018-03-15 ENCOUNTER — Emergency Department (HOSPITAL_COMMUNITY)
Admission: EM | Admit: 2018-03-15 | Discharge: 2018-03-15 | Discharge status: Against Medical Advice | Payer: Self-pay | Attending: Emergency Medicine | Admitting: Emergency Medicine

## 2018-03-15 ENCOUNTER — Emergency Department (HOSPITAL_COMMUNITY): Payer: Self-pay

## 2018-03-15 ENCOUNTER — Other Ambulatory Visit: Payer: Self-pay

## 2018-03-15 DIAGNOSIS — M545 Low back pain, unspecified: Secondary | ICD-10-CM

## 2018-03-15 DIAGNOSIS — F17210 Nicotine dependence, cigarettes, uncomplicated: Secondary | ICD-10-CM | POA: Insufficient documentation

## 2018-03-15 DIAGNOSIS — Z79899 Other long term (current) drug therapy: Secondary | ICD-10-CM | POA: Insufficient documentation

## 2018-03-15 DIAGNOSIS — R319 Hematuria, unspecified: Secondary | ICD-10-CM | POA: Insufficient documentation

## 2018-03-15 LAB — BASIC METABOLIC PANEL
ANION GAP: 7 (ref 5–15)
BUN: 10 mg/dL (ref 6–20)
CHLORIDE: 104 mmol/L (ref 98–111)
CO2: 29 mmol/L (ref 22–32)
Calcium: 9.2 mg/dL (ref 8.9–10.3)
Creatinine, Ser: 0.86 mg/dL (ref 0.61–1.24)
GFR calc non Af Amer: >60 mL/min (ref >60)
GLUCOSE: 106 mg/dL — AB (ref 70–99)
POTASSIUM: 4.2 mmol/L (ref 3.5–5.1)
Sodium: 140 mmol/L (ref 135–145)

## 2018-03-15 LAB — CBC
HEMATOCRIT: 43.1 % (ref 39.0–52.0)
HEMOGLOBIN: 14.5 g/dL (ref 13.0–17.0)
MCH: 30.3 pg (ref 26.0–34.0)
MCHC: 33.6 g/dL (ref 30.0–36.0)
MCV: 90.2 fL (ref 78.0–100.0)
Platelets: 262 10*3/uL (ref 150–400)
RBC: 4.78 MIL/uL (ref 4.22–5.81)
RDW: 13.5 % (ref 11.5–15.5)
WBC: 7.7 10*3/uL (ref 4.0–10.5)

## 2018-03-15 LAB — URINALYSIS, ROUTINE W REFLEX MICROSCOPIC
Bilirubin Urine: NEGATIVE
GLUCOSE, UA: NEGATIVE mg/dL
KETONES UR: NEGATIVE mg/dL
LEUKOCYTES UA: NEGATIVE
Nitrite: NEGATIVE
PH: 5 (ref 5.0–8.0)
Protein, ur: 30 mg/dL — AB
RBC / HPF: >50 RBC/hpf — ABNORMAL HIGH (ref 0–5)
SPECIFIC GRAVITY, URINE: 1.005 (ref 1.005–1.030)

## 2018-03-15 MED ORDER — HYDROMORPHONE HCL 1 MG/ML IJ SOLN
1.0000 mg | Freq: Once | INTRAMUSCULAR | Status: AC
  Administered 2018-03-15: 1 mg via INTRAVENOUS
  Filled 2018-03-15: qty 1

## 2018-03-15 MED ORDER — KETOROLAC TROMETHAMINE 30 MG/ML IJ SOLN
30.0000 mg | Freq: Once | INTRAMUSCULAR | Status: AC
  Administered 2018-03-15: 30 mg via INTRAVENOUS
  Filled 2018-03-15: qty 1

## 2018-03-15 MED ORDER — ORPHENADRINE CITRATE ER 100 MG PO TB12
100.0000 mg | ORAL_TABLET | Freq: Two times a day (BID) | ORAL | Status: DC
  Administered 2018-03-15: 100 mg via ORAL
  Filled 2018-03-15: qty 1

## 2018-03-15 NOTE — ED Provider Notes (Signed)
[facility] EMERGENCY DEPARTMENT Provider Note   CSN: [id number] Arrival date & time: 03/15/18  4098     History   Chief Complaint Chief Complaint  Patient presents with  . Back Pain    HPI Barry Wheeler is a 47 y.o. male.  HPI Patient reports he is got right sided lower back pain.  He reports is really severe.  Is been there for several days.  Is been getting worse.  He reports it is worse with movements.  He noted that his urine got very dark yesterday.  He denies pain or burning with urination.  No Abdominal pain nausea or vomiting. Past Medical History:  Diagnosis Date  . Drug abuse (HCC)   . Kidney stones     Patient Active Problem List   Diagnosis Date Noted  . Cellulitis 08/29/2017    Past Surgical History:  Procedure Laterality Date  . WRIST SURGERY          Home Medications    Prior to Admission medications   Medication Sig Start Date End Date Taking? Authorizing Provider  morphine (MSIR) 15 MG tablet Take 1 tablet (15 mg total) by mouth every 4 (four) hours as needed for severe pain. 02/03/18   Melene Plan, DO  mupirocin ointment Idelle Jo) 2 % Apply to affected area 3 times daily 09/03/17 09/03/18  Emily Filbert, MD  oxyCODONE (OXY IR/ROXICODONE) 5 MG immediate release tablet Take 1 tablet (5 mg total) by mouth every 4 (four) hours as needed for moderate pain. 08/31/17   Houston Siren, MD    Family History Family History  Problem Relation Age of Onset  . Hypertension Mother   . CAD Father     Social History Social History   Tobacco Use  . Smoking status: Current Every Day Smoker    Packs/day: 0.50    Types: Cigarettes  . Smokeless tobacco: Never Used  Substance Use Topics  . Alcohol use: No  . Drug use: Yes    Frequency: 2.0 times per week    Types: Cocaine    Comment: Heroin     Allergies   Patient has no known allergies.   Review of Systems Review of Systems 10 Systems reviewed and are negative for  acute change except as noted in the HPI.   Physical Exam Updated Vital Signs BP 127/80 (BP Location: Right Arm)   Pulse 83   Temp 98.4 F (36.9 C) (Oral)   Resp 16   Ht 5\' 5"  (1.651 m)   Wt 70.3 kg (155 lb)   SpO2 99%   BMI 25.79 kg/m   Physical Exam  Constitutional: He is oriented to person, place, and time. He appears well-developed and well-nourished.  HENT:  Head: Normocephalic and atraumatic.  Eyes: Pupils are equal, round, and reactive to light. EOM are normal.  Neck: Neck supple.  Cardiovascular: Normal rate, regular rhythm, normal heart sounds and intact distal pulses.  Pulmonary/Chest: Effort normal and breath sounds normal.  Abdominal: Soft. Bowel sounds are normal. He exhibits no distension. There is no tenderness.  Musculoskeletal: Normal range of motion. He exhibits no edema.  No  Significantly reproducible back pain to palpation in the muscular bodies of the bony prominences.  Neurological: He is alert and oriented to person, place, and time. He has normal strength. He exhibits normal muscle tone. Coordination normal. GCS eye subscore is 4. GCS verbal subscore is 5. GCS motor subscore is 6.  Skin: Skin is warm, dry and  intact.  Psychiatric: He has a normal mood and affect.     ED Treatments / Results  Labs (all labs ordered are listed, but only abnormal results are displayed) Labs Reviewed  URINALYSIS, ROUTINE W REFLEX MICROSCOPIC - Abnormal; Notable for the following components:      Result Value   Color, Urine AMBER (*)    APPearance HAZY (*)    Hgb urine dipstick LARGE (*)    Protein, ur 30 (*)    RBC / HPF >50 (*)    Bacteria, UA RARE (*)    All other components within normal limits  BASIC METABOLIC PANEL - Abnormal; Notable for the following components:   Glucose, Bld 106 (*)    All other components within normal limits  CBC    EKG None  Radiology Ct Renal Stone Study  Result Date: 03/15/2018 CLINICAL DATA:  47 y/o M; two weeks of lower  back pain. Hematuria with onset yesterday. History of kidney stones. EXAM: CT ABDOMEN AND PELVIS WITHOUT CONTRAST TECHNIQUE: Multidetector CT imaging of the abdomen and pelvis was performed following the standard protocol without IV contrast. COMPARISON:  04/27/2016 CT abdomen and pelvis. FINDINGS: Lower chest: No acute abnormality. Hepatobiliary: No focal liver abnormality is seen. No gallstones, gallbladder wall thickening, or biliary dilatation. Pancreas: Unremarkable. No pancreatic ductal dilatation or surrounding inflammatory changes. Spleen: Normal in size without focal abnormality. Adrenals/Urinary Tract: Adrenal glands are unremarkable. Kidneys are normal, without renal calculi, focal lesion, or hydronephrosis. Bladder is unremarkable. Stomach/Bowel: Stomach is within normal limits. Appendix appears normal. No evidence of bowel wall thickening, distention, or inflammatory changes. Vascular/Lymphatic: Aortic atherosclerosis. No enlarged abdominal or pelvic lymph nodes. Reproductive: Prostate is unremarkable. Other: Small stable right inguinal hernia containing fat. Musculoskeletal: Right anterolateral 6th subacute to chronic rib fracture. Stable chronic L5 pars defects. IMPRESSION: 1. No acute process identified. 2. Stable small right inguinal hernia containing fat. 3. Stable chronic L5 pars defects. Electronically Signed   By: Mitzi Hansen M.D.   On: 03/15/2018 22:59    Procedures Procedures (including critical care time)  Medications Ordered in ED Medications  orphenadrine (NORFLEX) 12 hr tablet 100 mg (100 mg Oral Given 03/15/18 2252)  ketorolac (TORADOL) 30 MG/ML injection 30 mg (30 mg Intravenous Given 03/15/18 2304)  HYDROmorphone (DILAUDID) injection 1 mg (1 mg Intravenous Given 03/15/18 2303)     Initial Impression / Assessment and Plan / ED Course  I have reviewed the triage vital signs and the nursing notes.  Pertinent labs & imaging results that were available during my  care of the patient were reviewed by me and considered in my medical decision making (see chart for details).      Final Clinical Impressions(s) / ED Diagnoses   Final diagnoses:  Right-sided low back pain without sciatica, unspecified chronicity  Hematuria, unspecified type  Patient had significant hematuria in urine specimen.  Combined with flank pain and back pain I felt this was suspicious for kidney stone.  Stone study obtained.  No stone present.  Prior to be able to review results with the patient, he walked out of the emergency department.  Nursing had difficult time gaining access and thus went to give ordered pain medications IM.  Patient's nurse reports that she gave the Toradol IM and then was giving the Dilaudid on the other side and the patient through his arms up almost causing her to get a needlestick complaining that that hurt and then grabbed his stuff and walked out.  Patient has  no neurologic deficits.  Pain is localized to the back.  He has not received prescriptions due to eloping.  ED Discharge Orders    None       Arby BarrettePfeiffer, Permelia Bamba, MD 03/15/18 2316

## 2018-03-15 NOTE — ED Notes (Signed)
Pt was given IM injections due to him being a very difficult stick and after several attempts by two nurses..MD said give IM. I gave the first shot which was toradol and then attempted to give the other shot, once I had punctured the skin the pt then drew back and almost hit me and the needle went flying and almost punctured me. I told him that was unacceptable and he said get the fuck away from me with that. He then told his mom to get up and lets get out of here. He left without being discharged and was very rude.

## 2018-03-15 NOTE — ED Triage Notes (Signed)
Patient c/o back pain for several weeks. Also c/o bloody urine and malodorous urine.

## 2018-03-21 ENCOUNTER — Inpatient Hospital Stay: Payer: Self-pay

## 2018-03-21 ENCOUNTER — Inpatient Hospital Stay
Admission: EM | Admit: 2018-03-21 | Discharge: 2018-03-22 | DRG: 872 | Discharge status: Against Medical Advice | Payer: Self-pay | Attending: Internal Medicine | Admitting: Internal Medicine

## 2018-03-21 ENCOUNTER — Other Ambulatory Visit: Payer: Self-pay

## 2018-03-21 ENCOUNTER — Emergency Department: Payer: Self-pay

## 2018-03-21 ENCOUNTER — Encounter: Payer: Self-pay | Admitting: Emergency Medicine

## 2018-03-21 DIAGNOSIS — F1910 Other psychoactive substance abuse, uncomplicated: Secondary | ICD-10-CM

## 2018-03-21 DIAGNOSIS — F1110 Opioid abuse, uncomplicated: Secondary | ICD-10-CM | POA: Diagnosis present

## 2018-03-21 DIAGNOSIS — A4159 Other Gram-negative sepsis: Principal | ICD-10-CM | POA: Diagnosis present

## 2018-03-21 DIAGNOSIS — R739 Hyperglycemia, unspecified: Secondary | ICD-10-CM | POA: Diagnosis present

## 2018-03-21 DIAGNOSIS — F17210 Nicotine dependence, cigarettes, uncomplicated: Secondary | ICD-10-CM | POA: Diagnosis present

## 2018-03-21 DIAGNOSIS — M549 Dorsalgia, unspecified: Secondary | ICD-10-CM | POA: Diagnosis present

## 2018-03-21 DIAGNOSIS — A419 Sepsis, unspecified organism: Secondary | ICD-10-CM | POA: Diagnosis present

## 2018-03-21 LAB — COMPREHENSIVE METABOLIC PANEL
ALK PHOS: 71 U/L (ref 38–126)
ALT: 58 U/L — AB (ref 0–44)
AST: 47 U/L — ABNORMAL HIGH (ref 15–41)
Albumin: 4.1 g/dL (ref 3.5–5.0)
Anion gap: 7 (ref 5–15)
BILIRUBIN TOTAL: 0.4 mg/dL (ref 0.3–1.2)
BUN: 17 mg/dL (ref 6–20)
CALCIUM: 9 mg/dL (ref 8.9–10.3)
CO2: 28 mmol/L (ref 22–32)
Chloride: 101 mmol/L (ref 98–111)
Creatinine, Ser: 0.75 mg/dL (ref 0.61–1.24)
GFR calc non Af Amer: >60 mL/min (ref >60)
Glucose, Bld: 129 mg/dL — ABNORMAL HIGH (ref 70–99)
Potassium: 3.9 mmol/L (ref 3.5–5.1)
SODIUM: 136 mmol/L (ref 135–145)
TOTAL PROTEIN: 8 g/dL (ref 6.5–8.1)

## 2018-03-21 LAB — URINE DRUG SCREEN, QUALITATIVE (ARMC ONLY)
AMPHETAMINES, UR SCREEN: NOT DETECTED
Barbiturates, Ur Screen: NOT DETECTED
COCAINE METABOLITE, UR ~~LOC~~: NOT DETECTED
Cannabinoid 50 Ng, Ur ~~LOC~~: NOT DETECTED
MDMA (ECSTASY) UR SCREEN: NOT DETECTED
Methadone Scn, Ur: NOT DETECTED
Opiate, Ur Screen: POSITIVE — AB
PHENCYCLIDINE (PCP) UR S: NOT DETECTED
Tricyclic, Ur Screen: NOT DETECTED

## 2018-03-21 LAB — RAPID HIV SCREEN (HIV 1/2 AB+AG)
HIV 1/2 Antibodies: NONREACTIVE
HIV-1 P24 Antigen - HIV24: NONREACTIVE

## 2018-03-21 LAB — PROCALCITONIN: PROCALCITONIN: 0.53 ng/mL

## 2018-03-21 LAB — CBC
HCT: 43.1 % (ref 40.0–52.0)
Hemoglobin: 14.8 g/dL (ref 13.0–18.0)
MCH: 30.1 pg (ref 26.0–34.0)
MCHC: 34.3 g/dL (ref 32.0–36.0)
MCV: 87.6 fL (ref 80.0–100.0)
Platelets: 281 10*3/uL (ref 150–440)
RBC: 4.92 MIL/uL (ref 4.40–5.90)
RDW: 14.1 % (ref 11.5–14.5)
WBC: 12.7 10*3/uL — ABNORMAL HIGH (ref 3.8–10.6)

## 2018-03-21 LAB — LACTIC ACID, PLASMA
Lactic Acid, Venous: 1.1 mmol/L (ref 0.5–1.9)
Lactic Acid, Venous: 1.1 mmol/L (ref 0.5–1.9)

## 2018-03-21 LAB — URINALYSIS, COMPLETE (UACMP) WITH MICROSCOPIC
Bacteria, UA: NONE SEEN
Bilirubin Urine: NEGATIVE
GLUCOSE, UA: NEGATIVE mg/dL
Ketones, ur: NEGATIVE mg/dL
Leukocytes, UA: NEGATIVE
NITRITE: NEGATIVE
Protein, ur: 30 mg/dL — AB
Specific Gravity, Urine: 1.021 (ref 1.005–1.030)
pH: 6 (ref 5.0–8.0)

## 2018-03-21 LAB — AMYLASE, RANDOM URINE: Amylase, Ur: 187 IU/L

## 2018-03-21 LAB — LIPASE, BLOOD: Lipase: 24 U/L (ref 11–51)

## 2018-03-21 MED ORDER — SODIUM CHLORIDE 0.9 % IV SOLN
2.0000 g | Freq: Once | INTRAVENOUS | Status: AC
  Administered 2018-03-21: 2 g via INTRAVENOUS
  Filled 2018-03-21: qty 20

## 2018-03-21 MED ORDER — SODIUM CHLORIDE 0.9 % IV BOLUS
1000.0000 mL | Freq: Once | INTRAVENOUS | Status: AC
  Administered 2018-03-21: 1000 mL via INTRAVENOUS

## 2018-03-21 MED ORDER — ACETAMINOPHEN 650 MG RE SUPP: 650.0000 mg | Freq: Four times a day (QID) | RECTAL | Status: DC | PRN

## 2018-03-21 MED ORDER — NICOTINE 14 MG/24HR TD PT24
14.0000 mg | MEDICATED_PATCH | Freq: Every day | TRANSDERMAL | Status: DC
  Administered 2018-03-21 – 2018-03-22 (×2): 14 mg via TRANSDERMAL
  Filled 2018-03-21 (×2): qty 1

## 2018-03-21 MED ORDER — ONDANSETRON HCL 4 MG/2ML IJ SOLN: 4.0000 mg | Freq: Four times a day (QID) | INTRAMUSCULAR | Status: DC | PRN

## 2018-03-21 MED ORDER — SODIUM CHLORIDE 0.9 % IV SOLN
1500.0000 mg | Freq: Once | INTRAVENOUS | Status: AC
Start: 2018-03-21 — End: 2018-03-21
  Administered 2018-03-21: 1500 mg via INTRAVENOUS
  Filled 2018-03-21: qty 1500

## 2018-03-21 MED ORDER — SODIUM CHLORIDE 0.9 % IV SOLN
2.0000 g | INTRAVENOUS | Status: DC
  Administered 2018-03-22: 2 g via INTRAVENOUS
  Filled 2018-03-21: qty 2

## 2018-03-21 MED ORDER — ACETAMINOPHEN 500 MG PO TABS
1000.0000 mg | ORAL_TABLET | Freq: Once | ORAL | Status: AC
  Administered 2018-03-21: 1000 mg via ORAL
  Filled 2018-03-21: qty 2

## 2018-03-21 MED ORDER — ENOXAPARIN SODIUM 40 MG/0.4ML ~~LOC~~ SOLN
40.0000 mg | SUBCUTANEOUS | Status: DC
  Filled 2018-03-21: qty 0.4

## 2018-03-21 MED ORDER — SENNOSIDES-DOCUSATE SODIUM 8.6-50 MG PO TABS: 1.0000 | ORAL_TABLET | Freq: Every evening | ORAL | Status: DC | PRN

## 2018-03-21 MED ORDER — BISACODYL 5 MG PO TBEC: 5.0000 mg | DELAYED_RELEASE_TABLET | Freq: Every day | ORAL | Status: DC | PRN

## 2018-03-21 MED ORDER — GADOBENATE DIMEGLUMINE 529 MG/ML IV SOLN
14.0000 mL | Freq: Once | INTRAVENOUS | Status: AC | PRN
  Administered 2018-03-21: 15 mL via INTRAVENOUS

## 2018-03-21 MED ORDER — FLUTICASONE PROPIONATE 50 MCG/ACT NA SUSP
2.0000 | Freq: Every day | NASAL | Status: DC
  Administered 2018-03-21 – 2018-03-22 (×2): 2 via NASAL
  Filled 2018-03-21: qty 16

## 2018-03-21 MED ORDER — ONDANSETRON HCL 4 MG PO TABS: 4.0000 mg | ORAL_TABLET | Freq: Four times a day (QID) | ORAL | Status: DC | PRN

## 2018-03-21 MED ORDER — HYDROMORPHONE HCL 1 MG/ML IJ SOLN
0.5000 mg | INTRAMUSCULAR | Status: DC | PRN
  Administered 2018-03-21 – 2018-03-22 (×7): 1 mg via INTRAVENOUS
  Filled 2018-03-21 (×8): qty 1

## 2018-03-21 MED ORDER — ACETAMINOPHEN 325 MG PO TABS
650.0000 mg | ORAL_TABLET | Freq: Four times a day (QID) | ORAL | Status: DC | PRN
  Administered 2018-03-21: 650 mg via ORAL
  Filled 2018-03-21: qty 2

## 2018-03-21 MED ORDER — VANCOMYCIN HCL IN DEXTROSE 1-5 GM/200ML-% IV SOLN
1000.0000 mg | Freq: Three times a day (TID) | INTRAVENOUS | Status: DC
  Administered 2018-03-21 – 2018-03-22 (×3): 1000 mg via INTRAVENOUS
  Filled 2018-03-21 (×6): qty 200

## 2018-03-21 NOTE — Progress Notes (Signed)
CODE SEPSIS - PHARMACY COMMUNICATION  **Broad Spectrum Antibiotics should be administered within 1 hour of Sepsis diagnosis**  Time Code Sepsis Called/Page Received: 0406  Antibiotics Ordered: vanc/ceftriaxone  Time of 1st antibiotic administration: 0514  Additional action taken by pharmacy: RN called and mentioned abx were given just not scanned  If necessary, Name of Provider/Nurse Contacted:    Thomasene Rippleavid  Aries Townley ,PharmD Clinical Pharmacist  03/21/2018  5:56 AM

## 2018-03-21 NOTE — ED Notes (Signed)
Chlondra, EDT, to transport pt to [id number]. Floor aware pt is en route.

## 2018-03-21 NOTE — ED Notes (Signed)
ED Provider at bedside. 

## 2018-03-21 NOTE — Progress Notes (Signed)
Unable to place Midline, patient yelled stop in the middle of the procedure. RN made aware.

## 2018-03-21 NOTE — H&P (Signed)
[facility] [place] [facility]   PATIENT NAME: Barry Wheeler    MR#:  [id number]  DATE OF BIRTH:  07/22/1971  DATE OF ADMISSION:  03/21/2018  PRIMARY CARE PHYSICIAN: Patient, No Pcp Per   REQUESTING/REFERRING PHYSICIAN: Merrily Brittle, MD  CHIEF COMPLAINT:   Chief Complaint  Patient presents with  . Back Pain  . Abdominal Pain  . Hematuria    HISTORY OF PRESENT ILLNESS:  Barry Wheeler  is a 47 y.o. male with a known history of heroin use (s/p overdose, in remission x1-45mo) p/w fever, back pain, hematuria. Pt states that he has been having persistent fever x3d, Tmax 104 on Sunday 03/20/2018. He endorses chills, rigors, diaphoresis and night sweats. He states he developed hematuria (w/o pain/dysuria/frequency/urgency/hesitancy/nocuturia/incontinence) last week, and had evaluation performed by ED, (-) urinary stones or infxn. He endorses a chronic dry non-productive cough, unchanged from baseline. He endorses headache, nausea and blurred vision. He feels weak, fatigued and generally unwell. He does not want to take narcotics for pain due to his Hx, but is in moderate distress 2/2 pain.  PAST MEDICAL HISTORY:   Past Medical History:  Diagnosis Date  . Drug abuse (HCC)   . Kidney stones     PAST SURGICAL HISTORY:   Past Surgical History:  Procedure Laterality Date  . WRIST SURGERY      SOCIAL HISTORY:   Social History   Tobacco Use  . Smoking status: Current Every Day Smoker    Packs/day: 0.50    Types: Cigarettes  . Smokeless tobacco: Never Used  Substance Use Topics  . Alcohol use: No    FAMILY HISTORY:   Family History  Problem Relation Age of Onset  . Hypertension Mother   . CAD Father     DRUG ALLERGIES:  No Known Allergies  REVIEW OF SYSTEMS:   Review of Systems  Constitutional: Positive for chills, diaphoresis, fever and malaise/fatigue. Negative for weight loss.  HENT: Negative for congestion, ear pain, hearing loss,  nosebleeds, sinus pain, sore throat and tinnitus.   Eyes: Positive for blurred vision. Negative for double vision and photophobia.  Respiratory: Positive for cough. Negative for hemoptysis, sputum production, shortness of breath and wheezing.   Cardiovascular: Negative for chest pain, palpitations, orthopnea, claudication, leg swelling and PND.  Gastrointestinal: Positive for nausea and vomiting. Negative for abdominal pain, blood in stool, constipation, diarrhea, heartburn and melena.  Genitourinary: Positive for hematuria. Negative for dysuria, flank pain, frequency and urgency.  Musculoskeletal: Positive for back pain. Negative for falls, joint pain, myalgias and neck pain.  Skin: Negative for itching and rash.  Neurological: Positive for weakness. Negative for dizziness, tingling, tremors, sensory change, speech change, focal weakness, seizures, loss of consciousness and headaches.  Psychiatric/Behavioral: Negative for memory loss. The patient does not have insomnia.    MEDICATIONS AT HOME:   Prior to Admission medications   Medication Sig Start Date End Date Taking? Authorizing Provider  acetaminophen (TYLENOL) 325 MG tablet Take 650 mg by mouth every 6 (six) hours as needed.   Yes [provider]  omega-3 acid ethyl esters (LOVAZA) 1 g capsule Take 1 g by mouth daily.   Yes [provider]      VITAL SIGNS:  Blood pressure 112/66, pulse 85, temperature 98.3 F (36.8 C), temperature source Oral, resp. rate 13, height 5\' 5"  (1.651 m), weight 70.3 kg (155 lb), SpO2 96 %.  PHYSICAL EXAMINATION:  Physical Exam  Constitutional: He is oriented to person,  place, and time. He appears well-developed and well-nourished. He is active and cooperative. He appears toxic. He appears ill. He appears distressed. He is not intubated.  HENT:  Head: Normocephalic and atraumatic.  Mouth/Throat: Oropharynx is clear and moist. No oropharyngeal exudate.  Eyes: Conjunctivae, EOM and lids  are normal. No scleral icterus.  Neck: Neck supple. No JVD present. No thyromegaly present.  Cardiovascular: Normal rate, regular rhythm, S1 normal and S2 normal.  No extrasystoles are present. Exam reveals no gallop, no S3, no S4, no distant heart sounds and no friction rub.  Murmur heard.  Systolic murmur is present with a grade of 2/6. Pulmonary/Chest: Effort normal and breath sounds normal. No accessory muscle usage or stridor. No apnea, no tachypnea and no bradypnea. He is not intubated. No respiratory distress. He has no decreased breath sounds. He has no wheezes. He has no rhonchi. He has no rales.  Abdominal: Soft. Normal appearance and bowel sounds are normal. He exhibits no distension. There is no tenderness. There is no rigidity, no rebound and no guarding.  Musculoskeletal: Normal range of motion. He exhibits no edema or tenderness.  Lymphadenopathy:    He has no cervical adenopathy.  Neurological: He is alert and oriented to person, place, and time. He is not disoriented.  Skin: Skin is warm, dry and intact. No rash noted. He is not diaphoretic. No erythema.  Psychiatric: He has a normal mood and affect. His speech is normal and behavior is normal. Judgment and thought content normal. Cognition and memory are normal.   LABORATORY PANEL:   CBC Recent Labs  Lab 03/21/18 0230  WBC 12.7*  HGB 14.8  HCT 43.1  PLT 281   ------------------------------------------------------------------------------------------------------------------  Chemistries  Recent Labs  Lab 03/21/18 0230  NA 136  K 3.9  CL 101  CO2 28  GLUCOSE 129*  BUN 17  CREATININE 0.75  CALCIUM 9.0  AST 47*  ALT 58*  ALKPHOS 71  BILITOT 0.4   ------------------------------------------------------------------------------------------------------------------  Cardiac Enzymes No results for input(s): TROPONINI in the last 168  hours. ------------------------------------------------------------------------------------------------------------------  RADIOLOGY:  Mr Cervical Spine W Or Wo Contrast  Result Date: 03/21/2018 CLINICAL DATA:  47 y/o M; 1 week of back pain and fever. History of IV drug use. EXAM: MRI CERVICAL SPINE WITHOUT AND WITH CONTRAST MRI THORACIC SPINE WITHOUT AND WITH CONTRAST MRI LUMBAR SPINE WITHOUT AND WITH CONTRAST TECHNIQUE: Multisequence MR imaging of the spine from the cervical spine to the sacrum was performed prior to and following IV contrast administration for evaluation of spinal metastatic disease. CONTRAST:  15 cc MultiHance COMPARISON:  None. FINDINGS: MRI CERVICAL SPINE FINDINGS Alignment: Straightening of cervical lordosis without listhesis. Vertebrae: No fracture, evidence of discitis, or bone lesion. No abnormal enhancement. Cord: Normal signal and morphology.  No abnormal enhancement. Posterior Fossa, vertebral arteries, paraspinal tissues: Negative. Disc levels: C2-3: No significant disc displacement, foraminal stenosis, or canal stenosis. C3-4: No significant disc displacement, foraminal stenosis, or canal stenosis. C4-5: Mild disc osteophyte complex with bilateral uncovertebral and facet hypertrophy. Disc contact on the anterior cord with mild cord flattening and mild canal stenosis. Mild bilateral foraminal stenosis. C5-6: Mild disc osteophyte complex with prominent right larger than left uncovertebral and facet hypertrophy. Moderate right and mild left foraminal stenosis. No canal stenosis. C6-7: Mild disc osteophyte complex with left-greater-than-right uncovertebral and facet hypertrophy. Moderate left and mild right foraminal stenosis. No canal stenosis. C7-T1: No significant disc displacement, foraminal stenosis, or canal stenosis. MRI THORACIC SPINE FINDINGS Alignment:  Physiologic.  Vertebrae: No fracture, evidence of discitis, or bone lesion. No abnormal enhancement. Cord:  Normal signal  and morphology.  No abnormal enhancement. Paraspinal and other soft tissues: Negative. Disc levels: No significant disc displacement, foraminal stenosis, or canal stenosis. MRI LUMBAR SPINE FINDINGS Segmentation:  Standard. Alignment:  Physiologic. Vertebrae: No fracture, evidence of discitis, or bone lesion. No abnormal enhancement. Conus medullaris: Extends to the L1 level and appears normal. Paraspinal and other soft tissues: Negative. Disc levels: L1-2: No significant disc displacement, foraminal stenosis, or canal stenosis. L2-3: Disc desiccation, mild loss of intervertebral disc space height, and mild disc bulge. No significant foraminal or canal stenosis. L3-4: Disc desiccation with mild loss of intervertebral disc space height, mild disc bulge, and mild facet hypertrophy. Mild left-greater-than-right neural foraminal stenosis. No significant canal stenosis. L4-5: Disc desiccation with moderate loss of intervertebral disc space height, mild disc bulge, small central disc protrusion, and facet hypertrophy. Mild bilateral foraminal and lateral recess stenosis. No significant canal stenosis. L5-S1: No significant disc displacement, foraminal stenosis, or canal stenosis. IMPRESSION: 1. No evidence for discitis osteomyelitis. No bone or cord signal abnormality. 2. Cervical spondylosis with predominantly discogenic degenerative changes greatest at the C5-6 level. Multilevel mild and moderate foraminal stenosis. Mild C4-5 canal stenosis. 3. No significant degenerative changes of the thoracic spine. 4. Lumbar spondylosis with predominantly discogenic degenerative changes at L2-3 through L4-5. Mild bilateral L3-4 and L4-5 foraminal stenosis. No high-grade foraminal or canal stenosis. Electronically Signed   By: Mitzi HansenLance  Furusawa-Stratton M.D.   On: 03/21/2018 07:00   Mr Thoracic Spine W Wo Contrast  Result Date: 03/21/2018 CLINICAL DATA:  47 y/o M; 1 week of back pain and fever. History of IV drug use. EXAM: MRI  CERVICAL SPINE WITHOUT AND WITH CONTRAST MRI THORACIC SPINE WITHOUT AND WITH CONTRAST MRI LUMBAR SPINE WITHOUT AND WITH CONTRAST TECHNIQUE: Multisequence MR imaging of the spine from the cervical spine to the sacrum was performed prior to and following IV contrast administration for evaluation of spinal metastatic disease. CONTRAST:  15 cc MultiHance COMPARISON:  None. FINDINGS: MRI CERVICAL SPINE FINDINGS Alignment: Straightening of cervical lordosis without listhesis. Vertebrae: No fracture, evidence of discitis, or bone lesion. No abnormal enhancement. Cord: Normal signal and morphology.  No abnormal enhancement. Posterior Fossa, vertebral arteries, paraspinal tissues: Negative. Disc levels: C2-3: No significant disc displacement, foraminal stenosis, or canal stenosis. C3-4: No significant disc displacement, foraminal stenosis, or canal stenosis. C4-5: Mild disc osteophyte complex with bilateral uncovertebral and facet hypertrophy. Disc contact on the anterior cord with mild cord flattening and mild canal stenosis. Mild bilateral foraminal stenosis. C5-6: Mild disc osteophyte complex with prominent right larger than left uncovertebral and facet hypertrophy. Moderate right and mild left foraminal stenosis. No canal stenosis. C6-7: Mild disc osteophyte complex with left-greater-than-right uncovertebral and facet hypertrophy. Moderate left and mild right foraminal stenosis. No canal stenosis. C7-T1: No significant disc displacement, foraminal stenosis, or canal stenosis. MRI THORACIC SPINE FINDINGS Alignment:  Physiologic. Vertebrae: No fracture, evidence of discitis, or bone lesion. No abnormal enhancement. Cord:  Normal signal and morphology.  No abnormal enhancement. Paraspinal and other soft tissues: Negative. Disc levels: No significant disc displacement, foraminal stenosis, or canal stenosis. MRI LUMBAR SPINE FINDINGS Segmentation:  Standard. Alignment:  Physiologic. Vertebrae: No fracture, evidence of  discitis, or bone lesion. No abnormal enhancement. Conus medullaris: Extends to the L1 level and appears normal. Paraspinal and other soft tissues: Negative. Disc levels: L1-2: No significant disc displacement, foraminal stenosis, or canal stenosis. L2-3: Disc desiccation,  mild loss of intervertebral disc space height, and mild disc bulge. No significant foraminal or canal stenosis. L3-4: Disc desiccation with mild loss of intervertebral disc space height, mild disc bulge, and mild facet hypertrophy. Mild left-greater-than-right neural foraminal stenosis. No significant canal stenosis. L4-5: Disc desiccation with moderate loss of intervertebral disc space height, mild disc bulge, small central disc protrusion, and facet hypertrophy. Mild bilateral foraminal and lateral recess stenosis. No significant canal stenosis. L5-S1: No significant disc displacement, foraminal stenosis, or canal stenosis. IMPRESSION: 1. No evidence for discitis osteomyelitis. No bone or cord signal abnormality. 2. Cervical spondylosis with predominantly discogenic degenerative changes greatest at the C5-6 level. Multilevel mild and moderate foraminal stenosis. Mild C4-5 canal stenosis. 3. No significant degenerative changes of the thoracic spine. 4. Lumbar spondylosis with predominantly discogenic degenerative changes at L2-3 through L4-5. Mild bilateral L3-4 and L4-5 foraminal stenosis. No high-grade foraminal or canal stenosis. Electronically Signed   By: Mitzi Hansen M.D.   On: 03/21/2018 07:00   Mr Lumbar Spine W Wo Contrast  Result Date: 03/21/2018 CLINICAL DATA:  47 y/o M; 1 week of back pain and fever. History of IV drug use. EXAM: MRI CERVICAL SPINE WITHOUT AND WITH CONTRAST MRI THORACIC SPINE WITHOUT AND WITH CONTRAST MRI LUMBAR SPINE WITHOUT AND WITH CONTRAST TECHNIQUE: Multisequence MR imaging of the spine from the cervical spine to the sacrum was performed prior to and following IV contrast administration for  evaluation of spinal metastatic disease. CONTRAST:  15 cc MultiHance COMPARISON:  None. FINDINGS: MRI CERVICAL SPINE FINDINGS Alignment: Straightening of cervical lordosis without listhesis. Vertebrae: No fracture, evidence of discitis, or bone lesion. No abnormal enhancement. Cord: Normal signal and morphology.  No abnormal enhancement. Posterior Fossa, vertebral arteries, paraspinal tissues: Negative. Disc levels: C2-3: No significant disc displacement, foraminal stenosis, or canal stenosis. C3-4: No significant disc displacement, foraminal stenosis, or canal stenosis. C4-5: Mild disc osteophyte complex with bilateral uncovertebral and facet hypertrophy. Disc contact on the anterior cord with mild cord flattening and mild canal stenosis. Mild bilateral foraminal stenosis. C5-6: Mild disc osteophyte complex with prominent right larger than left uncovertebral and facet hypertrophy. Moderate right and mild left foraminal stenosis. No canal stenosis. C6-7: Mild disc osteophyte complex with left-greater-than-right uncovertebral and facet hypertrophy. Moderate left and mild right foraminal stenosis. No canal stenosis. C7-T1: No significant disc displacement, foraminal stenosis, or canal stenosis. MRI THORACIC SPINE FINDINGS Alignment:  Physiologic. Vertebrae: No fracture, evidence of discitis, or bone lesion. No abnormal enhancement. Cord:  Normal signal and morphology.  No abnormal enhancement. Paraspinal and other soft tissues: Negative. Disc levels: No significant disc displacement, foraminal stenosis, or canal stenosis. MRI LUMBAR SPINE FINDINGS Segmentation:  Standard. Alignment:  Physiologic. Vertebrae: No fracture, evidence of discitis, or bone lesion. No abnormal enhancement. Conus medullaris: Extends to the L1 level and appears normal. Paraspinal and other soft tissues: Negative. Disc levels: L1-2: No significant disc displacement, foraminal stenosis, or canal stenosis. L2-3: Disc desiccation, mild loss of  intervertebral disc space height, and mild disc bulge. No significant foraminal or canal stenosis. L3-4: Disc desiccation with mild loss of intervertebral disc space height, mild disc bulge, and mild facet hypertrophy. Mild left-greater-than-right neural foraminal stenosis. No significant canal stenosis. L4-5: Disc desiccation with moderate loss of intervertebral disc space height, mild disc bulge, small central disc protrusion, and facet hypertrophy. Mild bilateral foraminal and lateral recess stenosis. No significant canal stenosis. L5-S1: No significant disc displacement, foraminal stenosis, or canal stenosis. IMPRESSION: 1. No evidence for discitis osteomyelitis.  No bone or cord signal abnormality. 2. Cervical spondylosis with predominantly discogenic degenerative changes greatest at the C5-6 level. Multilevel mild and moderate foraminal stenosis. Mild C4-5 canal stenosis. 3. No significant degenerative changes of the thoracic spine. 4. Lumbar spondylosis with predominantly discogenic degenerative changes at L2-3 through L4-5. Mild bilateral L3-4 and L4-5 foraminal stenosis. No high-grade foraminal or canal stenosis. Electronically Signed   By: Mitzi Hansen M.D.   On: 03/21/2018 07:00   Dg Chest Port 1 View  Result Date: 03/21/2018 CLINICAL DATA:  47 y/o M; upper back pain and shortness of breath, cough. EXAM: PORTABLE CHEST 1 VIEW COMPARISON:  02/03/2018 chest radiograph. FINDINGS: Stable heart size and mediastinal contours are within normal limits. Both lungs are clear. The visualized skeletal structures are unremarkable. IMPRESSION: No active disease. Electronically Signed   By: Mitzi Hansen M.D.   On: 03/21/2018 04:27   IMPRESSION AND PLAN:   A/P: 35M w/ Hx heroin use p/w fever + back pain + hematuria, SIRS (+), suspected sepsis. Presentation concerning for bacteremia, endocarditis, possible discitis/osteomyelitis/paraspinal abscess (ruled out by MRI), possible renal infarct.  Hyperglycemia, transaminasemia. -07/30 CT A/P (-) renal stones -MRI C/T/L spine (-) discitis/abscess/osteomyelitis -(+) F/WBC, SIRS (+), suspected sepsis -PCT 0.53 -Lactate 1.1 -BCx x2 pending -Echo pending -Broad-spectrum ABx, Ceftriaxone + Vancomycin -IVF -Pain ctrl -Regular diet -Lovenox -Full code -Admission, > 2 midnights   All the records are reviewed and case discussed with ED provider. Management plans discussed with the patient, family and they are in agreement.  CODE STATUS: Full code  TOTAL TIME TAKING CARE OF THIS PATIENT: 75 minutes.    Barbaraann Rondo M.D on 03/21/2018 at 7:46 AM  Between 7am to 6pm - Pager - [phone]  After 6pm go to [email or web address] - Social research officer, government  [facility]  Office  [phone]  CC: Primary care physician; Patient, No Pcp Per   Note: This dictation was prepared with Dragon dictation along with smaller phrase technology. Any transcriptional errors that result from this process are unintentional.

## 2018-03-21 NOTE — ED Notes (Signed)
Patient sitting in lobby with eyes closed in no acute distress.

## 2018-03-21 NOTE — ED Notes (Signed)
Patient to MRI.

## 2018-03-21 NOTE — ED Notes (Signed)
Floor notified that patient is in MRI at this time.

## 2018-03-21 NOTE — Progress Notes (Signed)
Pharmacy Antibiotic Note  Barry Wheeler is a 47 y.o. male admitted on 03/21/2018 with sepsis.  Pharmacy has been consulted for vancomycin dosing. Patient received vanc 1.5g IV x 1 in ED  Plan: Will start vanc 1g IV q8h  Will draw vanc trough 08/06 @ 1400 prior to 4th dose. Patient also is on ceftriaxone 2g IV daily  Ke 0.0957 T1/2 8 hrs Goal trough 15 - 20 mcg/mL  Height: 5\' 5"  (165.1 cm) Weight: 155 lb (70.3 kg) IBW/kg (Calculated) : 61.5  Temp (24hrs), Avg:99.7 F (37.6 C), Min:98.3 F (36.8 C), Max:101.9 F (38.8 C)  Recent Labs  Lab 03/15/18 1945 03/21/18 0230 03/21/18 0449  WBC 7.7 12.7*  --   CREATININE 0.86 0.75  --   LATICACIDVEN  --   --  1.1    Estimated Creatinine Clearance: 102.5 mL/min (by C-G formula based on SCr of 0.75 mg/dL).    No Known Allergies   Thank you for allowing pharmacy to be a part of this patient's care.  Thomasene Rippleavid Prajna Vanderpool, PharmD, BCPS Clinical Pharmacist 03/21/2018

## 2018-03-21 NOTE — ED Notes (Addendum)
Patient is very difficult IV stick attempted IV to right antecub without success..  MD will attempt ultrasound guided IV attempt when available.

## 2018-03-21 NOTE — ED Notes (Signed)
Returned to room from MRI

## 2018-03-21 NOTE — ED Notes (Signed)
Admitting MD if with patient and family.

## 2018-03-21 NOTE — ED Notes (Signed)
Patient reports having symptoms for approximately 1 week with back pain and blood in his urine.  Reports over past day or 2 started with fever.  Patient reports being a past IV drug user states he has been clean for approximately 2 months.  Patient with track marks noted to right hand and back of right arm.

## 2018-03-21 NOTE — ED Provider Notes (Signed)
[facility] Emergency Department Provider Note  ____________________________________________   First MD Initiated Contact with Patient 03/21/18 [phone]     (approximate)  I have reviewed the triage vital signs and the nursing notes.   HISTORY  Chief Complaint Back Pain; Abdominal Pain; and Hematuria    HPI Barry Wheeler is a 47 y.o. male who comes to the emergency department  with about 9 or 10 days of fever low back and upper back pain.  He also reports intermittent hematuria.  He was seen at [facility] emergency department 5 days ago where he had a urinalysis which was positive for hematuria however had a CT scan which was negative for stone.  He eloped from the hospital prior to receiving his results.  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  Today he had a fever to 104 degrees at home which concerned him so he took Tylenol and came to our emergency department for further evaluation.  He denies dysuria frequency or hesitancy.  He takes no medications.  His symptoms have been insidious onset are now constant.  Nothing seems to make them better or worse.  He denies numbness or weakness in his legs.  He reports normal perianal sensation.   Past Medical History:  Diagnosis Date  . Drug abuse (HCC)   . Kidney stones     Patient Active Problem List   Diagnosis Date Noted  . Sepsis (HCC) 03/21/2018  . Cellulitis 08/29/2017    Past Surgical History:  Procedure Laterality Date  . WRIST SURGERY      Prior to Admission medications   Medication Sig Start Date End Date Taking? Authorizing Provider  acetaminophen (TYLENOL) 325 MG tablet Take 650 mg by mouth every 6 (six) hours as needed.   Yes [provider]  omega-3 acid ethyl esters (LOVAZA) 1 g capsule Take 1 g by mouth daily.   Yes [provider]    Allergies Patient has no known allergies.  Family History  Problem Relation Age of Onset    . Hypertension Mother   . CAD Father     Social History Social History   Tobacco Use  . Smoking status: Current Every Day Smoker    Packs/day: 0.50    Types: Cigarettes  . Smokeless tobacco: Never Used  Substance Use Topics  . Alcohol use: No  . Drug use: Not Currently    Frequency: 2.0 times per week    Types: Cocaine    Comment: pt denies using street drugs for "a month or 2"    Review of Systems Constitutional: Positive for fevers and chills Eyes: No visual changes. ENT: No sore throat. Cardiovascular: Denies chest pain. Respiratory: Denies shortness of breath. Gastrointestinal: No abdominal pain.  No nausea, no vomiting.  No diarrhea.  No constipation. Genitourinary: Negative for dysuria. Musculoskeletal: Negative for back pain. Skin: Negative for rash. Neurological: Negative for headaches, focal weakness or numbness.   ____________________________________________   PHYSICAL EXAM:  VITAL SIGNS: ED Triage Vitals  Enc Vitals Group     BP 03/21/18 0201 (!) 136/98     Pulse Rate 03/21/18 0201 (!) 101     Resp 03/21/18 0201 16     Temp 03/21/18 0201 99 F (37.2 C)     Temp Source 03/21/18 0201 Oral     SpO2 03/21/18 0201 97 %     Weight 03/21/18 0212 155 lb (70.3 kg)  Height 03/21/18 0212 5\' 5"  (1.651 m)     Head Circumference --      Peak Flow --      Pain Score 03/21/18 0212 9     Pain Loc --      Pain Edu? --      Excl. in GC? --     Constitutional: Alert and oriented x4 appears uncomfortable and moving in a somewhat stiff manner Eyes: PERRL EOMI. Head: Atraumatic. Nose: No congestion/rhinnorhea. Mouth/Throat: No trismus Neck: No stridor.   Cardiovascular: Tachycardic rate, regular rhythm.  2 out of 6 systolic murmur Respiratory: Slightly increased respiratory effort.  No retractions. Lungs CTAB and moving good air Gastrointestinal: Soft nontender Digital rectal exam performed with normal prostate not boggy and not tender Musculoskeletal:  Diffuse mild tenderness along the lumbar spine Neurologic:  Normal speech and language. No gross focal neurologic deficits are appreciated. Skin: Track marks noted on his arm Psychiatric: Mood and affect are normal. Speech and behavior are normal.    ____________________________________________   DIFFERENTIAL includes but not limited to  Bacteremia, endocarditis, epidural abscess, prostatitis ____________________________________________   LABS (all labs ordered are listed, but only abnormal results are displayed)  Labs Reviewed  COMPREHENSIVE METABOLIC PANEL - Abnormal; Notable for the following components:      Result Value   Glucose, Bld 129 (*)    AST 47 (*)    ALT 58 (*)    All other components within normal limits  CBC - Abnormal; Notable for the following components:   WBC 12.7 (*)    All other components within normal limits  URINALYSIS, COMPLETE (UACMP) WITH MICROSCOPIC - Abnormal; Notable for the following components:   Color, Urine YELLOW (*)    APPearance HAZY (*)    Hgb urine dipstick LARGE (*)    Protein, ur 30 (*)    RBC / HPF >50 (*)    All other components within normal limits  URINE DRUG SCREEN, QUALITATIVE ([facility] ONLY) - Abnormal; Notable for the following components:   Opiate, Ur Screen POSITIVE (*)    Benzodiazepine, Ur Scrn TEST NOT PERFORMED, REAGENT NOT AVAILABLE (*)    All other components within normal limits  CULTURE, BLOOD (ROUTINE X 2)  CULTURE, BLOOD (ROUTINE X 2)  URINE CULTURE  CULTURE, BLOOD (SINGLE)  LIPASE, BLOOD  AMYLASE, RANDOM URINE  LACTIC ACID, PLASMA  PROCALCITONIN  RAPID HIV SCREEN (HIV 1/2 AB+AG)  LACTIC ACID, PLASMA  HEPATITIS B VIRUS (PROFILE VI)  HCV AB W REFLEX TO QUANT PCR  FUNGITELL, SERUM    Lab work reviewed by me shows elevated white count which is nonspecific.  Urinalysis with large blood __________________________________________  EKG   ____________________________________________  RADIOLOGY  X-ray of  the chest reviewed by me with no acute disease ____________________________________________   PROCEDURES  Procedure(s) performed: yes  Angiocath insertion Performed by: Merrily BrittleNeil Josalyn Dettmann  Consent: Verbal consent obtained. Risks and benefits: risks, benefits and alternatives were discussed Time out: Immediately prior to procedure a "time out" was called to verify the correct patient, procedure, equipment, support staff and site/side marked as required.  Preparation: Patient was prepped and draped in the usual sterile fashion.  Vein Location: RUE  Ultrasound Guided  Gauge: 16  Normal blood return and flush without difficulty Patient tolerance: Patient tolerated the procedure well with no immediate complications.  Angiocath insertion Performed by: Merrily BrittleNeil Dmauri Rosenow  Consent: Verbal consent obtained. Risks and benefits: risks, benefits and alternatives were discussed Time out: Immediately prior to procedure a "time  out" was called to verify the correct patient, procedure, equipment, support staff and site/side marked as required.  Preparation: Patient was prepped and draped in the usual sterile fashion.  Vein Location: Left external jugular   Gauge: 20  Normal blood return and flush without difficulty Patient tolerance: Patient tolerated the procedure well with no immediate complications.      .Critical Care Performed by: Merrily Brittle, MD Authorized by: Merrily Brittle, MD   Critical care provider statement:    Critical care time (minutes):  40   Critical care time was exclusive of:  Separately billable procedures and treating other patients   Critical care was necessary to treat or prevent imminent or life-threatening deterioration of the following conditions:  Sepsis   Critical care was time spent personally by me on the following activities:  Development of treatment plan with patient or surrogate, discussions with consultants, evaluation of patient's response to  treatment, examination of patient, obtaining history from patient or surrogate, ordering and performing treatments and interventions, ordering and review of laboratory studies, ordering and review of radiographic studies, pulse oximetry, re-evaluation of patient's condition and review of old charts    Critical Care performed: Yes  ____________________________________________   INITIAL IMPRESSION / ASSESSMENT AND PLAN / ED COURSE  Pertinent labs & imaging results that were available during my care of the patient were reviewed by me and considered in my medical decision making (see chart for details).   As part of my medical decision making, I reviewed the following data within the electronic MEDICAL RECORD NUMBER History obtained from family if available, nursing notes, old chart and ekg, as well as notes from prior ED visits.       ----------------------------------------- 4:07 AM on 03/21/2018 -----------------------------------------  The patient last used IV heroin about 4 months ago.  He is here with upper back and lower back pain along with fever to 104 degrees at home.  He felt warm to me so I checked a rectal temperature and it was 101.9 degrees.  I am concerned about epidural abscess.  He also has a heart murmur and I have concern for endocarditis.  Rechecking 3 blood cultures.  On further discussion the patient says that he almost never cleaned his skin, he always licked his needle, and he frequently reused needles.  I will cover him with IV vancomycin and IV ceftriaxone now along with Tylenol for the fever and IV fluids.  He declines opiate pain medication at this point although I have offered.  He does require inpatient admission for MRI, echocardiogram, and possibly TEE.  I spoke with the hospitalist who has graciously agreed to admit the patient to his service. ____________________________________________   FINAL CLINICAL IMPRESSION(S) / ED DIAGNOSES  Final diagnoses:  Sepsis,  due to unspecified organism (HCC)  IV drug abuse (HCC)      NEW MEDICATIONS STARTED DURING THIS VISIT:  New Prescriptions   No medications on file     Note:  This document was prepared using Dragon voice recognition software and may include unintentional dictation errors.     Merrily Brittle, MD 03/21/18 [phone]

## 2018-03-21 NOTE — Progress Notes (Signed)
Patient may need to have a midline IV placed d/t being such a hard stick.

## 2018-03-21 NOTE — Progress Notes (Signed)
[facility] - [place] at [facility]   PATIENT NAME: Angus Amini    MR#:  [id number]  DATE OF BIRTH:  [birth year]  SUBJECTIVE:  Patient having back pain this morning. No more fevers/chills since getting to the floor. Denies any chest pain or shortness of breath.  REVIEW OF SYSTEMS:  Review of Systems  Constitutional: Negative for chills and fever.  HENT: Negative for congestion and sore throat.   Eyes: Negative for blurred vision and double vision.  Respiratory: Negative for cough.   Cardiovascular: Positive for palpitations. Negative for chest pain.  Gastrointestinal: Negative for constipation, nausea and vomiting.  Genitourinary: Negative for dysuria and frequency.  Musculoskeletal: Positive for back pain. Negative for falls.  Neurological: Negative for dizziness and headaches.  Psychiatric/Behavioral: Negative for depression. The patient is not nervous/anxious.     DRUG ALLERGIES:  No Known Allergies VITALS:  Blood pressure 104/64, pulse 67, temperature 98 F (36.7 C), resp. rate 18, height 5\' 5"  (1.651 m), weight 70.4 kg (155 lb 3.2 oz), SpO2 98 %. PHYSICAL EXAMINATION:  Physical Exam  Constitutional: Sitting up in bed, in NAD, alert HENT: Normocephalic and atraumatic. Oropharynx is clear. MMM. EOMI. No scleral icterus. Neck: Neck supple. No JVD present. No thyromegaly present.  Cardiovascular: Normal rate, regular rhythm, S1 normal and S2 normal.  No murmurs, no gallops, no rubs. Pulmonary/Chest: Normal work of breathing, lungs clear, no wheezing/crackles. Abdominal: Soft, non-tender, non-distended, no rebound, no guarding  Musculoskeletal: Normal range of motion. No edema or tenderness.  Neurological: CN 2-12 grossly intact, no focal weakness. Skin: Skin is warm, dry and intact. No rash noted. He is not diaphoretic. No erythema.  Psychiatric: Appropriate affect, normal thought content, normal judgement.   LABORATORY PANEL:  Male CBC Recent Labs    Lab 03/21/18 0230  WBC 12.7*  HGB 14.8  HCT 43.1  PLT 281   ------------------------------------------------------------------------------------------------------------------ Chemistries  Recent Labs  Lab 03/21/18 0230  NA 136  K 3.9  CL 101  CO2 28  GLUCOSE 129*  BUN 17  CREATININE 0.75  CALCIUM 9.0  AST 47*  ALT 58*  ALKPHOS 71  BILITOT 0.4   RADIOLOGY:  Mr Cervical Spine W Or Wo Contrast  Result Date: 03/21/2018 CLINICAL DATA:  47 y/o M; 1 week of back pain and fever. History of IV drug use. EXAM: MRI CERVICAL SPINE WITHOUT AND WITH CONTRAST MRI THORACIC SPINE WITHOUT AND WITH CONTRAST MRI LUMBAR SPINE WITHOUT AND WITH CONTRAST TECHNIQUE: Multisequence MR imaging of the spine from the cervical spine to the sacrum was performed prior to and following IV contrast administration for evaluation of spinal metastatic disease. CONTRAST:  15 cc MultiHance COMPARISON:  None. FINDINGS: MRI CERVICAL SPINE FINDINGS Alignment: Straightening of cervical lordosis without listhesis. Vertebrae: No fracture, evidence of discitis, or bone lesion. No abnormal enhancement. Cord: Normal signal and morphology.  No abnormal enhancement. Posterior Fossa, vertebral arteries, paraspinal tissues: Negative. Disc levels: C2-3: No significant disc displacement, foraminal stenosis, or canal stenosis. C3-4: No significant disc displacement, foraminal stenosis, or canal stenosis. C4-5: Mild disc osteophyte complex with bilateral uncovertebral and facet hypertrophy. Disc contact on the anterior cord with mild cord flattening and mild canal stenosis. Mild bilateral foraminal stenosis. C5-6: Mild disc osteophyte complex with prominent right larger than left uncovertebral and facet hypertrophy. Moderate right and mild left foraminal stenosis. No canal stenosis. C6-7: Mild disc osteophyte complex with left-greater-than-right uncovertebral and facet hypertrophy. Moderate left and mild right foraminal stenosis. No canal  stenosis. C7-T1:  No significant disc displacement, foraminal stenosis, or canal stenosis. MRI THORACIC SPINE FINDINGS Alignment:  Physiologic. Vertebrae: No fracture, evidence of discitis, or bone lesion. No abnormal enhancement. Cord:  Normal signal and morphology.  No abnormal enhancement. Paraspinal and other soft tissues: Negative. Disc levels: No significant disc displacement, foraminal stenosis, or canal stenosis. MRI LUMBAR SPINE FINDINGS Segmentation:  Standard. Alignment:  Physiologic. Vertebrae: No fracture, evidence of discitis, or bone lesion. No abnormal enhancement. Conus medullaris: Extends to the L1 level and appears normal. Paraspinal and other soft tissues: Negative. Disc levels: L1-2: No significant disc displacement, foraminal stenosis, or canal stenosis. L2-3: Disc desiccation, mild loss of intervertebral disc space height, and mild disc bulge. No significant foraminal or canal stenosis. L3-4: Disc desiccation with mild loss of intervertebral disc space height, mild disc bulge, and mild facet hypertrophy. Mild left-greater-than-right neural foraminal stenosis. No significant canal stenosis. L4-5: Disc desiccation with moderate loss of intervertebral disc space height, mild disc bulge, small central disc protrusion, and facet hypertrophy. Mild bilateral foraminal and lateral recess stenosis. No significant canal stenosis. L5-S1: No significant disc displacement, foraminal stenosis, or canal stenosis. IMPRESSION: 1. No evidence for discitis osteomyelitis. No bone or cord signal abnormality. 2. Cervical spondylosis with predominantly discogenic degenerative changes greatest at the C5-6 level. Multilevel mild and moderate foraminal stenosis. Mild C4-5 canal stenosis. 3. No significant degenerative changes of the thoracic spine. 4. Lumbar spondylosis with predominantly discogenic degenerative changes at L2-3 through L4-5. Mild bilateral L3-4 and L4-5 foraminal stenosis. No high-grade foraminal or  canal stenosis. Electronically Signed   By: Mitzi Hansen M.D.   On: 03/21/2018 07:00   Mr Thoracic Spine W Wo Contrast  Result Date: 03/21/2018 CLINICAL DATA:  47 y/o M; 1 week of back pain and fever. History of IV drug use. EXAM: MRI CERVICAL SPINE WITHOUT AND WITH CONTRAST MRI THORACIC SPINE WITHOUT AND WITH CONTRAST MRI LUMBAR SPINE WITHOUT AND WITH CONTRAST TECHNIQUE: Multisequence MR imaging of the spine from the cervical spine to the sacrum was performed prior to and following IV contrast administration for evaluation of spinal metastatic disease. CONTRAST:  15 cc MultiHance COMPARISON:  None. FINDINGS: MRI CERVICAL SPINE FINDINGS Alignment: Straightening of cervical lordosis without listhesis. Vertebrae: No fracture, evidence of discitis, or bone lesion. No abnormal enhancement. Cord: Normal signal and morphology.  No abnormal enhancement. Posterior Fossa, vertebral arteries, paraspinal tissues: Negative. Disc levels: C2-3: No significant disc displacement, foraminal stenosis, or canal stenosis. C3-4: No significant disc displacement, foraminal stenosis, or canal stenosis. C4-5: Mild disc osteophyte complex with bilateral uncovertebral and facet hypertrophy. Disc contact on the anterior cord with mild cord flattening and mild canal stenosis. Mild bilateral foraminal stenosis. C5-6: Mild disc osteophyte complex with prominent right larger than left uncovertebral and facet hypertrophy. Moderate right and mild left foraminal stenosis. No canal stenosis. C6-7: Mild disc osteophyte complex with left-greater-than-right uncovertebral and facet hypertrophy. Moderate left and mild right foraminal stenosis. No canal stenosis. C7-T1: No significant disc displacement, foraminal stenosis, or canal stenosis. MRI THORACIC SPINE FINDINGS Alignment:  Physiologic. Vertebrae: No fracture, evidence of discitis, or bone lesion. No abnormal enhancement. Cord:  Normal signal and morphology.  No abnormal enhancement.  Paraspinal and other soft tissues: Negative. Disc levels: No significant disc displacement, foraminal stenosis, or canal stenosis. MRI LUMBAR SPINE FINDINGS Segmentation:  Standard. Alignment:  Physiologic. Vertebrae: No fracture, evidence of discitis, or bone lesion. No abnormal enhancement. Conus medullaris: Extends to the L1 level and appears normal. Paraspinal and other soft tissues: Negative.  Disc levels: L1-2: No significant disc displacement, foraminal stenosis, or canal stenosis. L2-3: Disc desiccation, mild loss of intervertebral disc space height, and mild disc bulge. No significant foraminal or canal stenosis. L3-4: Disc desiccation with mild loss of intervertebral disc space height, mild disc bulge, and mild facet hypertrophy. Mild left-greater-than-right neural foraminal stenosis. No significant canal stenosis. L4-5: Disc desiccation with moderate loss of intervertebral disc space height, mild disc bulge, small central disc protrusion, and facet hypertrophy. Mild bilateral foraminal and lateral recess stenosis. No significant canal stenosis. L5-S1: No significant disc displacement, foraminal stenosis, or canal stenosis. IMPRESSION: 1. No evidence for discitis osteomyelitis. No bone or cord signal abnormality. 2. Cervical spondylosis with predominantly discogenic degenerative changes greatest at the C5-6 level. Multilevel mild and moderate foraminal stenosis. Mild C4-5 canal stenosis. 3. No significant degenerative changes of the thoracic spine. 4. Lumbar spondylosis with predominantly discogenic degenerative changes at L2-3 through L4-5. Mild bilateral L3-4 and L4-5 foraminal stenosis. No high-grade foraminal or canal stenosis. Electronically Signed   By: Mitzi HansenLance  Furusawa-Stratton M.D.   On: 03/21/2018 07:00   Mr Lumbar Spine W Wo Contrast  Result Date: 03/21/2018 CLINICAL DATA:  47 y/o M; 1 week of back pain and fever. History of IV drug use. EXAM: MRI CERVICAL SPINE WITHOUT AND WITH CONTRAST MRI  THORACIC SPINE WITHOUT AND WITH CONTRAST MRI LUMBAR SPINE WITHOUT AND WITH CONTRAST TECHNIQUE: Multisequence MR imaging of the spine from the cervical spine to the sacrum was performed prior to and following IV contrast administration for evaluation of spinal metastatic disease. CONTRAST:  15 cc MultiHance COMPARISON:  None. FINDINGS: MRI CERVICAL SPINE FINDINGS Alignment: Straightening of cervical lordosis without listhesis. Vertebrae: No fracture, evidence of discitis, or bone lesion. No abnormal enhancement. Cord: Normal signal and morphology.  No abnormal enhancement. Posterior Fossa, vertebral arteries, paraspinal tissues: Negative. Disc levels: C2-3: No significant disc displacement, foraminal stenosis, or canal stenosis. C3-4: No significant disc displacement, foraminal stenosis, or canal stenosis. C4-5: Mild disc osteophyte complex with bilateral uncovertebral and facet hypertrophy. Disc contact on the anterior cord with mild cord flattening and mild canal stenosis. Mild bilateral foraminal stenosis. C5-6: Mild disc osteophyte complex with prominent right larger than left uncovertebral and facet hypertrophy. Moderate right and mild left foraminal stenosis. No canal stenosis. C6-7: Mild disc osteophyte complex with left-greater-than-right uncovertebral and facet hypertrophy. Moderate left and mild right foraminal stenosis. No canal stenosis. C7-T1: No significant disc displacement, foraminal stenosis, or canal stenosis. MRI THORACIC SPINE FINDINGS Alignment:  Physiologic. Vertebrae: No fracture, evidence of discitis, or bone lesion. No abnormal enhancement. Cord:  Normal signal and morphology.  No abnormal enhancement. Paraspinal and other soft tissues: Negative. Disc levels: No significant disc displacement, foraminal stenosis, or canal stenosis. MRI LUMBAR SPINE FINDINGS Segmentation:  Standard. Alignment:  Physiologic. Vertebrae: No fracture, evidence of discitis, or bone lesion. No abnormal enhancement.  Conus medullaris: Extends to the L1 level and appears normal. Paraspinal and other soft tissues: Negative. Disc levels: L1-2: No significant disc displacement, foraminal stenosis, or canal stenosis. L2-3: Disc desiccation, mild loss of intervertebral disc space height, and mild disc bulge. No significant foraminal or canal stenosis. L3-4: Disc desiccation with mild loss of intervertebral disc space height, mild disc bulge, and mild facet hypertrophy. Mild left-greater-than-right neural foraminal stenosis. No significant canal stenosis. L4-5: Disc desiccation with moderate loss of intervertebral disc space height, mild disc bulge, small central disc protrusion, and facet hypertrophy. Mild bilateral foraminal and lateral recess stenosis. No significant canal stenosis. L5-S1: No  significant disc displacement, foraminal stenosis, or canal stenosis. IMPRESSION: 1. No evidence for discitis osteomyelitis. No bone or cord signal abnormality. 2. Cervical spondylosis with predominantly discogenic degenerative changes greatest at the C5-6 level. Multilevel mild and moderate foraminal stenosis. Mild C4-5 canal stenosis. 3. No significant degenerative changes of the thoracic spine. 4. Lumbar spondylosis with predominantly discogenic degenerative changes at L2-3 through L4-5. Mild bilateral L3-4 and L4-5 foraminal stenosis. No high-grade foraminal or canal stenosis. Electronically Signed   By: Mitzi Hansen M.D.   On: 03/21/2018 07:00   Dg Chest Port 1 View  Result Date: 03/21/2018 CLINICAL DATA:  46 y/o M; upper back pain and shortness of breath, cough. EXAM: PORTABLE CHEST 1 VIEW COMPARISON:  02/03/2018 chest radiograph. FINDINGS: Stable heart size and mediastinal contours are within normal limits. Both lungs are clear. The visualized skeletal structures are unremarkable. IMPRESSION: No active disease. Electronically Signed   By: Mitzi Hansen M.D.   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 Initially febrile to 101.33F with WBC count of 12.7. Lactic acid 1.1, PCT 0.53. MRI c/t/l spine negative for discitis/osteomyelitis/abscess. - continue vanc/ceftriaxone - blood and urine cultures pending - trend procalcitonin - ECHO pending - continue IVFs - dilaudid prn for pain - cardiac monitoring  Hyperglycemia- blood sugar 129 on admission. - check a1c  Transaminitis- initial AST 47, ALT 58 - hepatitis panel pending - repeat cmp tomorrow  All the records are reviewed and case discussed with Care Management/Social Worker. Management plans discussed with the patient, family and they are in agreement.  CODE STATUS: Full Code  TOTAL TIME TAKING CARE OF THIS PATIENT: 35 minutes.   More than 50% of the time was spent in counseling/coordination of care: YES  POSSIBLE D/C IN 3-4 DAYS, DEPENDING ON CLINICAL CONDITION.   Jinny Blossom Mayo M.D on 03/21/2018 at 11:08 AM  Between 7am to 6pm - Pager - [phone]  After 6pm go to [email or web address] - Social research officer, government  [facility] [place] [facility]  Office  [phone]  CC: Primary care physician; Patient, No Pcp Per  Note: This dictation was prepared with Dragon dictation along with smaller phrase technology. Any transcriptional errors that result from this process are unintentional.

## 2018-03-21 NOTE — ED Triage Notes (Addendum)
Pt reports 9-10 days of hematuria, fever and upper left abd pain; pt was seen at [facility] on 03/15/18 but got angry with staff (they were going to give him Dilaudid IM and he didn't want it-recovering addict) and left without all lab results and diagnosis; pt says he's still feeling bad;  pt says his fever at home tonight was 104; took 1 ES tylenol around midnight and current temp is 99.0; pt says sometimes when he voids his urine is clear, other times it's "pink"; some lower back pain to spine;

## 2018-03-22 LAB — BLOOD CULTURE ID PANEL (REFLEXED)
ACINETOBACTER BAUMANNII: NOT DETECTED
CANDIDA ALBICANS: NOT DETECTED
CANDIDA GLABRATA: NOT DETECTED
CANDIDA KRUSEI: NOT DETECTED
Candida parapsilosis: NOT DETECTED
Candida tropicalis: NOT DETECTED
Carbapenem resistance: NOT DETECTED
ENTEROBACTERIACEAE SPECIES: DETECTED — AB
ENTEROCOCCUS SPECIES: NOT DETECTED
ESCHERICHIA COLI: NOT DETECTED
Enterobacter cloacae complex: DETECTED — AB
Haemophilus influenzae: NOT DETECTED
KLEBSIELLA OXYTOCA: NOT DETECTED
Klebsiella pneumoniae: NOT DETECTED
LISTERIA MONOCYTOGENES: NOT DETECTED
NEISSERIA MENINGITIDIS: NOT DETECTED
PSEUDOMONAS AERUGINOSA: NOT DETECTED
Proteus species: NOT DETECTED
SERRATIA MARCESCENS: NOT DETECTED
STAPHYLOCOCCUS SPECIES: NOT DETECTED
STREPTOCOCCUS AGALACTIAE: NOT DETECTED
STREPTOCOCCUS PNEUMONIAE: NOT DETECTED
Staphylococcus aureus (BCID): NOT DETECTED
Streptococcus pyogenes: NOT DETECTED
Streptococcus species: NOT DETECTED

## 2018-03-22 LAB — COMPREHENSIVE METABOLIC PANEL
ALT: 43 U/L (ref 0–44)
ANION GAP: 6 (ref 5–15)
AST: 27 U/L (ref 15–41)
Albumin: 3.8 g/dL (ref 3.5–5.0)
Alkaline Phosphatase: 70 U/L (ref 38–126)
BILIRUBIN TOTAL: 0.5 mg/dL (ref 0.3–1.2)
BUN: 12 mg/dL (ref 6–20)
CHLORIDE: 103 mmol/L (ref 98–111)
CO2: 26 mmol/L (ref 22–32)
Calcium: 8.8 mg/dL — ABNORMAL LOW (ref 8.9–10.3)
Creatinine, Ser: 0.58 mg/dL — ABNORMAL LOW (ref 0.61–1.24)
GFR calc Af Amer: >60 mL/min (ref >60)
Glucose, Bld: 97 mg/dL (ref 70–99)
Potassium: 4 mmol/L (ref 3.5–5.1)
Sodium: 135 mmol/L (ref 135–145)
TOTAL PROTEIN: 7.6 g/dL (ref 6.5–8.1)

## 2018-03-22 LAB — HEMOGLOBIN A1C
Hgb A1c MFr Bld: 5.6 % (ref 4.8–5.6)
MEAN PLASMA GLUCOSE: 114.02 mg/dL

## 2018-03-22 LAB — CBC
HEMATOCRIT: 39.3 % — AB (ref 40.0–52.0)
HEMOGLOBIN: 13.8 g/dL (ref 13.0–18.0)
MCH: 31 pg (ref 26.0–34.0)
MCHC: 35.1 g/dL (ref 32.0–36.0)
MCV: 88.2 fL (ref 80.0–100.0)
Platelets: 274 10*3/uL (ref 150–440)
RBC: 4.45 MIL/uL (ref 4.40–5.90)
RDW: 14.3 % (ref 11.5–14.5)
WBC: 10.7 10*3/uL — AB (ref 3.8–10.6)

## 2018-03-22 LAB — PROCALCITONIN: PROCALCITONIN: 0.31 ng/mL

## 2018-03-22 LAB — URINE CULTURE: Culture: NO GROWTH

## 2018-03-22 MED ORDER — SODIUM CHLORIDE 0.9 % IV SOLN
1.0000 g | Freq: Three times a day (TID) | INTRAVENOUS | Status: DC
  Administered 2018-03-22: 1 g via INTRAVENOUS
  Filled 2018-03-22 (×5): qty 1

## 2018-03-22 NOTE — Progress Notes (Signed)
Pharmacy Antibiotic Note  Barry Wheeler is a 47 y.o. male admitted on 03/21/2018 with sepsis.  Pharmacy has been consulted for vancomycin dosing. Patient received vanc 1.5g IV x 1 in ED  Plan: Will start vanc 1g IV q8h  Will draw vanc trough 08/06 @ 1400 prior to 4th dose. Patient also is on ceftriaxone 2g IV daily  8/6 Ceftriaxone changed to cefepime per nomogram.  Ke 0.0957 T1/2 8 hrs Goal trough 15 - 20 mcg/mL  Height: 5\' 5"  (165.1 cm) Weight: 155 lb 3.2 oz (70.4 kg) IBW/kg (Calculated) : 61.5  Temp (24hrs), Avg:98.6 F (37 C), Min:97.9 F (36.6 C), Max:100.1 F (37.8 C)  Recent Labs  Lab 03/15/18 1945 03/21/18 0230 03/21/18 0449 03/21/18 2103 03/22/18 0330  WBC 7.7 12.7*  --   --  10.7*  CREATININE 0.86 0.75  --   --  0.58*  LATICACIDVEN  --   --  1.1 1.1  --     Estimated Creatinine Clearance: 102.5 mL/min (A) (by C-G formula based on SCr of 0.58 mg/dL (L)).    No Known Allergies   Thank you for allowing pharmacy to be a part of this patient's care.  Thomasene Rippleavid Besanti, PharmD, BCPS Clinical Pharmacist 03/22/2018

## 2018-03-22 NOTE — Progress Notes (Signed)
Resent a fax to patients attorney about his court date today.   [phone]

## 2018-03-22 NOTE — Progress Notes (Addendum)
[facility] - [place] at [facility]   PATIENT NAME: Barry AquasClyde Villacis    MR#:  [id number]  DATE OF BIRTH:  12/12/1970  SUBJECTIVE:   Patient states he is still having back pain this morning. Pain medications helping a little bit. No pain that radiates down his legs. No chest pain, shortness of breath, fevers, or abdominal pain.  REVIEW OF SYSTEMS:  Review of Systems  Constitutional: Negative for chills and fever.  HENT: Negative for congestion and sore throat.   Eyes: Negative for blurred vision and double vision.  Respiratory: Negative for cough.   Cardiovascular: Positive for palpitations. Negative for chest pain.  Gastrointestinal: Negative for constipation, nausea and vomiting.  Genitourinary: Negative for dysuria and frequency.  Musculoskeletal: Positive for back pain. Negative for falls.  Neurological: Negative for dizziness and headaches.  Psychiatric/Behavioral: Negative for depression. The patient is not nervous/anxious.     DRUG ALLERGIES:  No Known Allergies VITALS:  Blood pressure 113/69, pulse 61, temperature 98.5 F (36.9 C), temperature source Oral, resp. rate 18, height 5\' 5"  (1.651 m), weight 70.4 kg (155 lb 3.2 oz), SpO2 97 %. PHYSICAL EXAMINATION:  Physical Exam  Constitutional: Sitting up in bed, in NAD, alert and answers questions HENT: Normocephalic and atraumatic. MMM. EOMI. No scleral icterus. Neck: Neck supple. No JVD present. No thyromegaly present.  Cardiovascular: Normal rate, regular rhythm, S1 normal and S2 normal.  No murmurs, no gallops, no rubs. Pulmonary/Chest: Normal work of breathing, lungs clear, no wheezing/crackles. Abdominal: Soft, non-tender, non-distended, no rebound, no guarding  Musculoskeletal: Normal range of motion. No edema or tenderness.  Neurological: CN 2-12 grossly intact, no focal weakness. Moves all extremities. Skin: No rashes or lesions on exposed skin Psychiatric: Appropriate affect, normal thought  content, normal judgement.   LABORATORY PANEL:  Male CBC Recent Labs  Lab 03/22/18 0330  WBC 10.7*  HGB 13.8  HCT 39.3*  PLT 274   ------------------------------------------------------------------------------------------------------------------ Chemistries  Recent Labs  Lab 03/22/18 0330  NA 135  K 4.0  CL 103  CO2 26  GLUCOSE 97  BUN 12  CREATININE 0.58*  CALCIUM 8.8*  AST 27  ALT 43  ALKPHOS 70  BILITOT 0.5   RADIOLOGY:  No results found. 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 MRI c/t/l spine negative for discitis/osteomyelitis/abscess. Tmax 100.1 in the last 24 hours. - continue vanc - ceftriaxone changed to cefepime due to blood cultures - will plan to repeat blood cultures tomorrow - procalcitonin trending down - ECHO pending - off IVFs, encourage po - dilaudid prn for pain - cardiac monitoring  Hyperglycemia- a1c 5.6 this admission - continue to monitor  Transaminitis- resolved after fluids - hepatitis panel pending  All the records are reviewed and case discussed with Care Management/Social Worker. Management plans discussed with the patient, family and they are in agreement.  CODE STATUS: Full Code  TOTAL TIME TAKING CARE OF THIS PATIENT: 35 minutes.   More than 50% of the time was spent in counseling/coordination of care: YES  POSSIBLE D/C IN 3-4 DAYS, DEPENDING ON CLINICAL CONDITION.   Jinny BlossomKaty D Mayo M.D on 03/22/2018 at 10:31 AM  Between 7am to 6pm - Pager - [phone]  After 6pm go to [email or web address] - Social research officer, governmentpassword EPAS [email or web address]  [facility] [place] [facility]  Office  [phone]  CC: Primary care physician; Patient, No Pcp Per  Note: This dictation was prepared with Dragon dictation along with smaller phrase technology. Any transcriptional errors  that result from this process are unintentional.

## 2018-03-22 NOTE — Progress Notes (Signed)
Called into patients room because his heart monitor was off.  Patient stated he was leaving AMA.  I attempted to talk him out of leaving but he was adamant about leaving stating he had to take care of some personal things.  His family here at bedside.  Tried to talk him out of leaving as well.  I made him sign AMA paper, took out both IVs.  Patient walked out of hospital on his own accord with family at his side.    Updated Dr. Nancy MarusMayo

## 2018-03-22 NOTE — Progress Notes (Signed)
PHARMACY - PHYSICIAN COMMUNICATION CRITICAL VALUE ALERT - BLOOD CULTURE IDENTIFICATION (BCID)  Barry Wheeler is an 47 y.o. male who [facility] to [facility] on [facility]/21/2018 with a chief complaint of   Assessment:  1/4 E. cloacae KPC(-) (include suspected source if known)  Name of physician (or Provider) Contacted: Sridharan  Current antibiotics: ceftriaxone/vancomycin  Changes to prescribed antibiotics recommended:  Changed to cefepime per nomogram.  Results for orders placed or performed during the hospital encounter of 03/21/18  Blood Culture ID Panel (Reflexed) (Collected: 03/21/2018  4:49 AM)  Result Value Ref Range   Enterococcus species NOT DETECTED NOT DETECTED   Listeria monocytogenes NOT DETECTED NOT DETECTED   Staphylococcus species NOT DETECTED NOT DETECTED   Staphylococcus aureus NOT DETECTED NOT DETECTED   Streptococcus species NOT DETECTED NOT DETECTED   Streptococcus agalactiae NOT DETECTED NOT DETECTED   Streptococcus pneumoniae NOT DETECTED NOT DETECTED   Streptococcus pyogenes NOT DETECTED NOT DETECTED   Acinetobacter baumannii NOT DETECTED NOT DETECTED   Enterobacteriaceae species DETECTED (A) NOT DETECTED   Enterobacter cloacae complex DETECTED (A) NOT DETECTED   Escherichia coli NOT DETECTED NOT DETECTED   Klebsiella oxytoca NOT DETECTED NOT DETECTED   Klebsiella pneumoniae NOT DETECTED NOT DETECTED   Proteus species NOT DETECTED NOT DETECTED   Serratia marcescens NOT DETECTED NOT DETECTED   Carbapenem resistance NOT DETECTED NOT DETECTED   Haemophilus influenzae NOT DETECTED NOT DETECTED   Neisseria meningitidis NOT DETECTED NOT DETECTED   Pseudomonas aeruginosa NOT DETECTED NOT DETECTED   Candida albicans NOT DETECTED NOT DETECTED   Candida glabrata NOT DETECTED NOT DETECTED   Candida krusei NOT DETECTED NOT DETECTED   Candida parapsilosis NOT DETECTED NOT DETECTED   Candida tropicalis NOT DETECTED NOT DETECTED    Kenndra Morris S 03/22/2018  5:35 AM

## 2018-03-23 ENCOUNTER — Other Ambulatory Visit: Payer: Self-pay

## 2018-03-23 ENCOUNTER — Encounter: Payer: Self-pay | Admitting: Emergency Medicine

## 2018-03-23 ENCOUNTER — Inpatient Hospital Stay
Admission: EM | Admit: 2018-03-23 | Discharge: 2018-03-23 | DRG: 894 | Discharge status: Against Medical Advice | Payer: Self-pay | Attending: Family Medicine | Admitting: Family Medicine

## 2018-03-23 DIAGNOSIS — F1910 Other psychoactive substance abuse, uncomplicated: Principal | ICD-10-CM | POA: Diagnosis present

## 2018-03-23 DIAGNOSIS — Z79899 Other long term (current) drug therapy: Secondary | ICD-10-CM

## 2018-03-23 DIAGNOSIS — Z5321 Procedure and treatment not carried out due to patient leaving prior to being seen by health care provider: Secondary | ICD-10-CM | POA: Diagnosis not present

## 2018-03-23 DIAGNOSIS — Z765 Malingerer [conscious simulation]: Secondary | ICD-10-CM

## 2018-03-23 DIAGNOSIS — R7881 Bacteremia: Secondary | ICD-10-CM | POA: Diagnosis present

## 2018-03-23 DIAGNOSIS — F17210 Nicotine dependence, cigarettes, uncomplicated: Secondary | ICD-10-CM | POA: Diagnosis present

## 2018-03-23 DIAGNOSIS — F1990 Other psychoactive substance use, unspecified, uncomplicated: Secondary | ICD-10-CM

## 2018-03-23 DIAGNOSIS — M545 Low back pain: Secondary | ICD-10-CM | POA: Diagnosis present

## 2018-03-23 DIAGNOSIS — B9689 Other specified bacterial agents as the cause of diseases classified elsewhere: Secondary | ICD-10-CM | POA: Diagnosis present

## 2018-03-23 DIAGNOSIS — G8929 Other chronic pain: Secondary | ICD-10-CM | POA: Diagnosis present

## 2018-03-23 LAB — FUNGITELL, SERUM

## 2018-03-23 LAB — CBC
HCT: 42.2 % (ref 40.0–52.0)
Hemoglobin: 14.6 g/dL (ref 13.0–18.0)
MCH: 30.4 pg (ref 26.0–34.0)
MCHC: 34.7 g/dL (ref 32.0–36.0)
MCV: 87.6 fL (ref 80.0–100.0)
Platelets: 331 10*3/uL (ref 150–440)
RBC: 4.81 MIL/uL (ref 4.40–5.90)
RDW: 14.3 % (ref 11.5–14.5)
WBC: 8.4 10*3/uL (ref 3.8–10.6)

## 2018-03-23 LAB — COMPREHENSIVE METABOLIC PANEL
ALBUMIN: 3.9 g/dL (ref 3.5–5.0)
ALT: 47 U/L — ABNORMAL HIGH (ref 0–44)
ANION GAP: 8 (ref 5–15)
AST: 34 U/L (ref 15–41)
Alkaline Phosphatase: 71 U/L (ref 38–126)
BILIRUBIN TOTAL: 0.4 mg/dL (ref 0.3–1.2)
BUN: 10 mg/dL (ref 6–20)
CO2: 27 mmol/L (ref 22–32)
Calcium: 9.2 mg/dL (ref 8.9–10.3)
Chloride: 103 mmol/L (ref 98–111)
Creatinine, Ser: 0.7 mg/dL (ref 0.61–1.24)
GFR calc Af Amer: >60 mL/min (ref >60)
GFR calc non Af Amer: >60 mL/min (ref >60)
GLUCOSE: 100 mg/dL — AB (ref 70–99)
POTASSIUM: 4.2 mmol/L (ref 3.5–5.1)
SODIUM: 138 mmol/L (ref 135–145)
TOTAL PROTEIN: 8.3 g/dL — AB (ref 6.5–8.1)

## 2018-03-23 LAB — HEPATITIS B VIRUS (PROFILE VI)
HEP B S AB: NONREACTIVE
Hep B C IgM: NEGATIVE
Hep B Core Total Ab: NEGATIVE
Hep B E Ab: NEGATIVE
Hep B E Ag: NEGATIVE
Hepatitis B Surface Ag: NEGATIVE

## 2018-03-23 LAB — LACTIC ACID, PLASMA
Lactic Acid, Venous: 1.4 mmol/L (ref 0.5–1.9)
Lactic Acid, Venous: 1.3 mmol/L (ref 0.5–1.9)

## 2018-03-23 MED ORDER — SODIUM CHLORIDE 0.9 % IV SOLN
2.0000 g | Freq: Three times a day (TID) | INTRAVENOUS | Status: DC
  Filled 2018-03-23 (×3): qty 2

## 2018-03-23 MED ORDER — VANCOMYCIN HCL IN DEXTROSE 1-5 GM/200ML-% IV SOLN
1000.0000 mg | Freq: Once | INTRAVENOUS | Status: AC
  Administered 2018-03-23: 1000 mg via INTRAVENOUS
  Filled 2018-03-23: qty 200

## 2018-03-23 MED ORDER — ENOXAPARIN SODIUM 40 MG/0.4ML ~~LOC~~ SOLN: 40.0000 mg | SUBCUTANEOUS | Status: DC

## 2018-03-23 MED ORDER — POLYETHYLENE GLYCOL 3350 17 G PO PACK: 17.0000 g | PACK | Freq: Every day | ORAL | Status: DC | PRN

## 2018-03-23 MED ORDER — DOCUSATE SODIUM 100 MG PO CAPS: 100.0000 mg | ORAL_CAPSULE | Freq: Two times a day (BID) | ORAL | Status: DC

## 2018-03-23 MED ORDER — OMEGA-3-ACID ETHYL ESTERS 1 G PO CAPS: 1.0000 g | ORAL_CAPSULE | Freq: Every day | ORAL | Status: DC

## 2018-03-23 MED ORDER — SODIUM CHLORIDE 0.9% FLUSH: 3.0000 mL | INTRAVENOUS | Status: DC | PRN

## 2018-03-23 MED ORDER — CYCLOBENZAPRINE HCL 10 MG PO TABS: 5.0000 mg | ORAL_TABLET | Freq: Three times a day (TID) | ORAL | Status: DC | PRN

## 2018-03-23 MED ORDER — FLEET ENEMA 7-19 GM/118ML RE ENEM: 1.0000 | ENEMA | Freq: Once | RECTAL | Status: DC | PRN

## 2018-03-23 MED ORDER — BISACODYL 5 MG PO TBEC: 5.0000 mg | DELAYED_RELEASE_TABLET | Freq: Every day | ORAL | Status: DC | PRN

## 2018-03-23 MED ORDER — TRAMADOL HCL 50 MG PO TABS
50.0000 mg | ORAL_TABLET | Freq: Four times a day (QID) | ORAL | Status: DC | PRN
  Filled 2018-03-23: qty 1

## 2018-03-23 MED ORDER — ACETAMINOPHEN 325 MG PO TABS: 650.0000 mg | ORAL_TABLET | Freq: Four times a day (QID) | ORAL | Status: DC | PRN

## 2018-03-23 MED ORDER — IBUPROFEN 400 MG PO TABS: 800.0000 mg | ORAL_TABLET | Freq: Four times a day (QID) | ORAL | Status: DC | PRN

## 2018-03-23 MED ORDER — SODIUM CHLORIDE 0.9 % IV SOLN
2.0000 g | Freq: Once | INTRAVENOUS | Status: AC
  Administered 2018-03-23: 2 g via INTRAVENOUS
  Filled 2018-03-23: qty 2

## 2018-03-23 MED ORDER — SODIUM CHLORIDE 0.9 % IV SOLN: 250.0000 mL | INTRAVENOUS | Status: DC | PRN

## 2018-03-23 MED ORDER — VANCOMYCIN HCL IN DEXTROSE 1-5 GM/200ML-% IV SOLN
INTRAVENOUS | Status: AC
  Administered 2018-03-23: 1000 mg via INTRAVENOUS
  Filled 2018-03-23: qty 200

## 2018-03-23 MED ORDER — SODIUM CHLORIDE 0.9 % IV SOLN
INTRAVENOUS | Status: AC
  Administered 2018-03-23: 2 g via INTRAVENOUS
  Filled 2018-03-23: qty 2

## 2018-03-23 MED ORDER — SODIUM CHLORIDE 0.9% FLUSH: 3.0000 mL | Freq: Two times a day (BID) | INTRAVENOUS | Status: DC

## 2018-03-23 MED ORDER — ACETAMINOPHEN 650 MG RE SUPP: 650.0000 mg | Freq: Four times a day (QID) | RECTAL | Status: DC | PRN

## 2018-03-23 MED ORDER — ONDANSETRON HCL 4 MG PO TABS: 4.0000 mg | ORAL_TABLET | Freq: Four times a day (QID) | ORAL | Status: DC | PRN

## 2018-03-23 MED ORDER — GABAPENTIN 100 MG PO CAPS
200.0000 mg | ORAL_CAPSULE | Freq: Three times a day (TID) | ORAL | Status: DC
  Filled 2018-03-23: qty 2

## 2018-03-23 MED ORDER — ONDANSETRON HCL 4 MG/2ML IJ SOLN: 4.0000 mg | Freq: Four times a day (QID) | INTRAMUSCULAR | Status: DC | PRN

## 2018-03-23 NOTE — Discharge Summary (Signed)
[facility] [place] [facility]   PATIENT NAME: Barry Wheeler    MR#:  [id number]  DATE OF BIRTH:  July 25, 1971  DATE OF ADMISSION:  03/21/2018   ADMITTING PHYSICIAN: Barbaraann RondoPrasanna Sridharan, MD  DATE OF DISCHARGE: 03/22/2018  4:38 PM  PRIMARY CARE PHYSICIAN: Patient, No Pcp Per   ADMISSION DIAGNOSIS:  IV drug abuse (HCC) [F19.10] Sepsis, due to unspecified organism (HCC) [A41.9] DISCHARGE DIAGNOSIS:  Active Problems:   Sepsis (HCC)  SECONDARY DIAGNOSIS:   Past Medical History:  Diagnosis Date  . Drug abuse (HCC)   . Kidney stones    HOSPITAL COURSE:   Barry CheshireClyde is a 47 year old male with a PMH of heroin use who presented to the ED with fevers and back pain. Code sepsis was called and he was started on Vancomycin + Ceftriaxone. MRI C/T/L spine was performed and was negative for discitis, osteomyelitis, or abscess. Blood cultures grew Enterobacter. Ceftriaxone was switched to cefepime. ECHO was ordered, but he left AMA on 03/22/18 because his mother was being evicted and he needed to help her find a place to live.  DISCHARGE CONDITIONS:  Poor- requiring continued medical care CONSULTS OBTAINED:  Treatment Team:  Barbaraann RondoSridharan, Prasanna, MD DRUG ALLERGIES:  No Known Allergies DISCHARGE MEDICATIONS:   Allergies as of 03/22/2018   No Known Allergies     Medication List    ASK your doctor about these medications   acetaminophen 325 MG tablet Commonly known as:  TYLENOL Take 650 mg by mouth every 6 (six) hours as needed.   omega-3 acid ethyl esters 1 g capsule Commonly known as:  LOVAZA Take 1 g by mouth daily.        DISCHARGE INSTRUCTIONS:  1. Patient left AMA and was advised to return to the hospital as soon as he is able. DIET:  Regular diet DISCHARGE CONDITION:  Serious ACTIVITY:  Activity as tolerated OXYGEN:  Home Oxygen: No.  Oxygen Delivery: room air DISCHARGE LOCATION:  home   If you experience worsening of your admission symptoms,  develop shortness of breath, life threatening emergency, suicidal or homicidal thoughts you must seek medical attention immediately by calling 911 or calling your MD immediately  if symptoms less severe.  You Must read complete instructions/literature along with all the possible adverse reactions/side effects for all the Medicines you take and that have been prescribed to you. Take any new Medicines after you have completely understood and accpet all the possible adverse reactions/side effects.   Please note  You were cared for by a hospitalist during your hospital stay. If you have any questions about your discharge medications or the care you received while you were in the hospital after you are discharged, you can call the unit and asked to speak with the hospitalist on call if the hospitalist that took care of you is not available. Once you are discharged, your primary care physician will handle any further medical issues. Please note that NO REFILLS for any discharge medications will be authorized once you are discharged, as it is imperative that you return to your primary care physician (or establish a relationship with a primary care physician if you do not have one) for your aftercare needs so that they can reassess your need for medications and monitor your lab values.    On the day of Discharge:  VITAL SIGNS:  Blood pressure 120/74, pulse 60, temperature 98.2 F (36.8 C), temperature source Oral, resp. rate 16, height 5\' 5"  (1.651 m),  weight 70.4 kg (155 lb 3.2 oz), SpO2 99 %. PHYSICAL EXAMINATION:  GENERAL:  47 y.o.-year-old patient lying in the bed with no acute distress.  EYES: Pupils equal, round, reactive to light and accommodation. No scleral icterus. Extraocular muscles intact.  HEENT: Head atraumatic, normocephalic. Oropharynx and nasopharynx clear.  NECK:  Supple, no jugular venous distention. No thyroid enlargement, no tenderness.  LUNGS: Normal breath sounds bilaterally, no  wheezing, rales,rhonchi or crepitation. No use of accessory muscles of respiration.  CARDIOVASCULAR: S1, S2 normal. No murmurs, rubs, or gallops.  ABDOMEN: Soft, non-tender, non-distended. Bowel sounds present. No organomegaly or mass.  EXTREMITIES: No pedal edema, cyanosis, or clubbing.  NEUROLOGIC: Cranial nerves II through XII are intact. Muscle strength 5/5 in all extremities. Sensation intact. Gait not checked.  PSYCHIATRIC: The patient is alert and oriented x 3.  SKIN: No obvious rash, lesion, or ulcer.  DATA REVIEW:   CBC Recent Labs  Lab 03/23/18 1428  WBC 8.4  HGB 14.6  HCT 42.2  PLT 331    Chemistries  Recent Labs  Lab 03/23/18 1428  NA 138  K 4.2  CL 103  CO2 27  GLUCOSE 100*  BUN 10  CREATININE 0.70  CALCIUM 9.2  AST 34  ALT 47*  ALKPHOS 71  BILITOT 0.4     Microbiology Results  Results for orders placed or performed during the hospital encounter of 03/21/18  Urine culture     Status: None   Collection Time: 03/21/18  2:45 AM  Result Value Ref Range Status   Specimen Description   Final    URINE, RANDOM Performed at [facility], [address] [place], [place] [address]    Special Requests   Final    NONE Performed at [facility], [address], [place], [place] [address]    Culture   Final    NO GROWTH Performed at [facility], [address] [place], [place] [address]    Report Status 03/22/2018 FINAL  Final  Blood Culture (routine x 2)     Status: None (Preliminary result)   Collection Time: 03/21/18  4:49 AM  Result Value Ref Range Status   Specimen Description BLOOD BLOOD RIGHT ARM  Final   Special Requests   Final    BOTTLES DRAWN AEROBIC AND ANAEROBIC Blood Culture results may not be optimal due to an excessive volume of blood received in culture bottles   Culture  Setup Time   Final    GRAM NEGATIVE RODS AEROBIC BOTTLE ONLY CRITICAL VALUE NOTED.  VALUE IS CONSISTENT WITH PREVIOUSLY REPORTED AND CALLED  VALUE. Performed at [facility], [address] [place], [place] [address]    Culture GRAM NEGATIVE RODS  Final   Report Status PENDING  Incomplete  Blood Culture (routine x 2)     Status: Abnormal (Preliminary result)   Collection Time: 03/21/18  4:49 AM  Result Value Ref Range Status   Specimen Description   Final    BLOOD BLOOD LEFT HAND Performed at [facility], [address], [place], [place] [address]    Special Requests   Final    BOTTLES DRAWN AEROBIC AND ANAEROBIC Blood Culture results may not be optimal due to an excessive volume of blood received in culture bottles Performed at [facility], [address] [place], [place] [address]    Culture  Setup Time   Final    GRAM NEGATIVE RODS AEROBIC BOTTLE ONLY CRITICAL RESULT CALLED TO,  READ BACK BY AND VERIFIED WITH: MATT MCBANE AT 0522 ON 03/22/18 [facility].    Culture (A)  Final    ENTEROBACTER CLOACAE SUSCEPTIBILITIES TO FOLLOW Performed at [facility], [address] [place], [place] [address]    Report Status PENDING  Incomplete  Blood Culture ID Panel (Reflexed)     Status: Abnormal   Collection Time: 03/21/18  4:49 AM  Result Value Ref Range Status   Enterococcus species NOT DETECTED NOT DETECTED Final   Listeria monocytogenes NOT DETECTED NOT DETECTED Final   Staphylococcus species NOT DETECTED NOT DETECTED Final   Staphylococcus aureus NOT DETECTED NOT DETECTED Final   Streptococcus species NOT DETECTED NOT DETECTED Final   Streptococcus agalactiae NOT DETECTED NOT DETECTED Final   Streptococcus pneumoniae NOT DETECTED NOT DETECTED Final   Streptococcus pyogenes NOT DETECTED NOT DETECTED Final   Acinetobacter baumannii NOT DETECTED NOT DETECTED Final   Enterobacteriaceae species DETECTED (A) NOT DETECTED Final    Comment: Enterobacteriaceae represent a large family of gram-negative bacteria, not a single organism. CRITICAL RESULT CALLED TO, READ BACK BY AND VERIFIED  WITH: MATT MCBANE AT 0522 ON 03/22/18 [facility].    Enterobacter cloacae complex DETECTED (A) NOT DETECTED Final    Comment: CRITICAL RESULT CALLED TO, READ BACK BY AND VERIFIED WITH: MATT MCBANE AT 0522 ON 03/22/18 [facility].    Escherichia coli NOT DETECTED NOT DETECTED Final   Klebsiella oxytoca NOT DETECTED NOT DETECTED Final   Klebsiella pneumoniae NOT DETECTED NOT DETECTED Final   Proteus species NOT DETECTED NOT DETECTED Final   Serratia marcescens NOT DETECTED NOT DETECTED Final   Carbapenem resistance NOT DETECTED NOT DETECTED Final   Haemophilus influenzae NOT DETECTED NOT DETECTED Final   Neisseria meningitidis NOT DETECTED NOT DETECTED Final   Pseudomonas aeruginosa NOT DETECTED NOT DETECTED Final   Candida albicans NOT DETECTED NOT DETECTED Final   Candida glabrata NOT DETECTED NOT DETECTED Final   Candida krusei NOT DETECTED NOT DETECTED Final   Candida parapsilosis NOT DETECTED NOT DETECTED Final   Candida tropicalis NOT DETECTED NOT DETECTED Final    Comment: Performed at [facility], [address] [place], [place] [address]    RADIOLOGY:  No results found.   Management plans discussed with the patient, family and they are in agreement.  CODE STATUS: Prior   TOTAL TIME TAKING CARE OF THIS PATIENT: 35 minutes.    Jinny Blossom Williom Cedar M.D on 03/23/2018 at 4:36 PM  Between 7am to 6pm - Pager [phone]  After 6pm go to [email or web address] - Social research officer, government  [facility] [place] [facility]  Office  [phone]  CC: Primary care physician; Patient, No Pcp Per   Note: This dictation was prepared with Dragon dictation along with smaller phrase technology. Any transcriptional errors that result from this process are unintentional.

## 2018-03-23 NOTE — ED Notes (Signed)
Admitting MD at bedside.

## 2018-03-23 NOTE — ED Provider Notes (Signed)
[facility] Emergency Department Provider Note  ____________________________________________  Time seen: Approximately 3:12 PM  I have reviewed the triage vital signs and the nursing notes.   HISTORY  Chief Complaint Blood Infection    HPI Barry Wheeler is a 47 y.o. male with a history of kidney stones and IV drug use who is admitted to the hospital 2 days ago with sepsis and found to be bacteremic with Enterobacter species.  He left AGAINST MEDICAL ADVICE yesterday afternoon due to needing to attend some family affairs.  Last night he continued having fevers chills sweats.  No chest pain or shortness of breath.  He returns today to continue his medical care.  Symptoms are waxing waning without aggravating or alleviating factors.  He last had vancomycin yesterday morning.  He had a single dose of cefepime yesterday at about 3 PM.      Past Medical History:  Diagnosis Date  . Drug abuse (HCC)   . Kidney stones      Patient Active Problem List   Diagnosis Date Noted  . Sepsis (HCC) 03/21/2018  . Cellulitis 08/29/2017     Past Surgical History:  Procedure Laterality Date  . WRIST SURGERY       Prior to Admission medications   Medication Sig Start Date End Date Taking? Authorizing Provider  acetaminophen (TYLENOL) 325 MG tablet Take 650 mg by mouth every 6 (six) hours as needed.    [provider]  omega-3 acid ethyl esters (LOVAZA) 1 g capsule Take 1 g by mouth daily.    [provider]     Allergies Patient has no known allergies.   Family History  Problem Relation Age of Onset  . Hypertension Mother   . CAD Father     Social History Social History   Tobacco Use  . Smoking status: Current Every Day Smoker    Packs/day: 0.50    Types: Cigarettes  . Smokeless tobacco: Never Used  Substance Use Topics  . Alcohol use: No  . Drug use: Not Currently    Types: Cocaine    Comment: Heroin, Opiods    Review of  Systems  Constitutional:   Positive subjective fever and chills.  ENT:   No sore throat. No rhinorrhea. Cardiovascular:   No chest pain or syncope. Respiratory:   No dyspnea or cough. Gastrointestinal:   Negative for abdominal pain, vomiting and diarrhea.  Musculoskeletal:   Chronic low back pain All other systems reviewed and are negative except as documented above in ROS and HPI.  ____________________________________________   PHYSICAL EXAM:  VITAL SIGNS: ED Triage Vitals  Enc Vitals Group     BP 03/23/18 1334 125/74     Pulse Rate 03/23/18 1334 74     Resp 03/23/18 1334 16     Temp 03/23/18 1334 98.3 F (36.8 C)     Temp Source 03/23/18 1334 Oral     SpO2 03/23/18 1334 98 %     Weight 03/23/18 1335 155 lb (70.3 kg)     Height 03/23/18 1335 5\' 6"  (1.676 m)     Head Circumference --      Peak Flow --      Pain Score 03/23/18 1334 9     Pain Loc --      Pain Edu? --      Excl. in GC? --     Vital signs reviewed, nursing assessments reviewed.   Constitutional:   Alert and oriented. Non-toxic appearance. Eyes:  Conjunctivae are normal. EOMI ENT      Head:   Normocephalic and atraumatic.      Nose:   No congestion/rhinnorhea.       Mouth/Throat:   MMM, no pharyngeal erythema. No peritonsillar mass.       Neck:   No meningismus. Full ROM. Hematological/Lymphatic/Immunilogical:   No cervical lymphadenopathy. Cardiovascular:   RRR. Symmetric bilateral radial and DP pulses.  No murmurs. Cap refill less than 2 seconds. Respiratory:   Normal respiratory effort without tachypnea/retractions. Breath sounds are clear and equal bilaterally. No wheezes/rales/rhonchi. Gastrointestinal:   Soft and nontender. Non distended. There is no CVA tenderness.  No rebound, rigidity, or guarding.  Musculoskeletal:   Normal range of motion in all extremities. No joint effusions.  No lower extremity tenderness.  No edema.  No swelling, no evidence of cellulitis necrotizing fasciitis or  abscess. Neurologic:   Normal speech and language.  Motor grossly intact. No acute focal neurologic deficits are appreciated.  Skin:    Skin is warm, dry and intact. No rash noted.  No petechiae, purpura, or bullae.  Some track marks on upper extremities.  No splinter hemorrhages or Osler's nodes.  Some scattered small hyperpigmented Macules on bilateral palms. ____________________________________________    LABS (pertinent positives/negatives) (all labs ordered are listed, but only abnormal results are displayed) Labs Reviewed  COMPREHENSIVE METABOLIC PANEL - Abnormal; Notable for the following components:      Result Value   Glucose, Bld 100 (*)    Total Protein 8.3 (*)    ALT 47 (*)    All other components within normal limits  CULTURE, BLOOD (SINGLE)  CBC  LACTIC ACID, PLASMA  LACTIC ACID, PLASMA   ____________________________________________   EKG    ____________________________________________    RADIOLOGY  No results found.  ____________________________________________   PROCEDURES Procedures  ____________________________________________    CLINICAL IMPRESSION / ASSESSMENT AND PLAN / ED COURSE  Pertinent labs & imaging results that were available during my care of the patient were reviewed by me and considered in my medical decision making (see chart for details).    Patient who left the hospital AGAINST MEDICAL ADVICE yesterday while being treated for bacteremia and evaluated for suspected endocarditis returns to the ED today to continue his care.  Vital signs are normal.  Additional blood culture sent.  Cefepime and vancomycin now.  Discussed with the hospitalist for further management.      ____________________________________________   FINAL CLINICAL IMPRESSION(S) / ED DIAGNOSES    Final diagnoses:  Bacteremia due to Enterobacter species  IV drug user     ED Discharge Orders    None      Portions of this note were generated with  dragon dictation software. Dictation errors may occur despite best attempts at proofreading.    Sharman Cheek, MD 03/23/18 1517

## 2018-03-23 NOTE — Progress Notes (Signed)
Pt decided to leave AMA. Pt reported, "it was a mistake coming here, [facility] IV was removed by RN. RN explained that insurance would not cover stay if pt chose to leave AMA and that he should pursue treatment. Pt declined wheelchair and left with mom.

## 2018-03-23 NOTE — Progress Notes (Signed)
Family Meeting Note  Advance Directive:yes  Today a meeting took place with the Patient.  Patient is able to participate   The following clinical team members were present during this meeting:MD  The following were discussed:Patient's diagnosis: Enterobacter bacteremia/IV drug abuser, Patient's progosis: Unable to determine and Goals for treatment: Full Code  Additional follow-up to be provided: prn  Time spent during discussion:20 minutes  Bertrum SolMontell D Mick Tanguma, MD

## 2018-03-23 NOTE — H&P (Signed)
[facility] [place] [facility]   PATIENT NAME: Barry Wheeler    MR#:  [id number]  DATE OF BIRTH:  Apr 13, 1971  DATE OF ADMISSION:  03/23/2018  PRIMARY CARE PHYSICIAN: Patient, No Pcp Per   REQUESTING/REFERRING PHYSICIAN:   CHIEF COMPLAINT:   Chief Complaint  Patient presents with  . Blood Infection    HISTORY OF PRESENT ILLNESS: Barry AquasClyde Campion  is a 47 y.o. male with a known history per below which includes IV drug abuse, left hospital AGAINST MEDICAL ADVICE 2 days ago with acute Enterobacter bacteremia, patient presents today to the emergency room now wanting to be treated for his infection, patient complains of low back pain only, patient's mother at the bedside, patient no apparent distress, resting comfortably in bed, patient is now being admitted for acute Enterobacter bacteremia secondary to IV drug abuse.  PAST MEDICAL HISTORY:   Past Medical History:  Diagnosis Date  . Drug abuse (HCC)   . Kidney stones     PAST SURGICAL HISTORY:  Past Surgical History:  Procedure Laterality Date  . WRIST SURGERY      SOCIAL HISTORY:  Social History   Tobacco Use  . Smoking status: Current Every Day Smoker    Packs/day: 0.50    Types: Cigarettes  . Smokeless tobacco: Never Used  Substance Use Topics  . Alcohol use: No    FAMILY HISTORY:  Family History  Problem Relation Age of Onset  . Hypertension Mother   . CAD Father     DRUG ALLERGIES: No Known Allergies  REVIEW OF SYSTEMS:   CONSTITUTIONAL: No fever, fatigue or weakness.  EYES: No blurred or double vision.  EARS, NOSE, AND THROAT: No tinnitus or ear pain.  RESPIRATORY: No cough, shortness of breath, wheezing or hemoptysis.  CARDIOVASCULAR: No chest pain, orthopnea, edema.  GASTROINTESTINAL: No nausea, vomiting, diarrhea or abdominal pain.  GENITOURINARY: No dysuria, hematuria.  ENDOCRINE: No polyuria, nocturia,  HEMATOLOGY: No anemia, easy bruising or bleeding SKIN: No rash or  lesion. MUSCULOSKELETAL: No joint pain or arthritis.   NEUROLOGIC: No tingling, numbness, weakness.  PSYCHIATRY: No anxiety or depression.   MEDICATIONS AT HOME:  Prior to Admission medications   Medication Sig Start Date End Date Taking? Authorizing Provider  acetaminophen (TYLENOL) 325 MG tablet Take 650 mg by mouth every 6 (six) hours as needed.   Yes [provider]  omega-3 acid ethyl esters (LOVAZA) 1 g capsule Take 1 g by mouth daily.   Yes [provider]      PHYSICAL EXAMINATION:   VITAL SIGNS: Blood pressure (!) 141/89, pulse 61, temperature 98.3 F (36.8 C), temperature source Oral, resp. rate 18, height 5\' 6"  (1.676 m), weight 70.3 kg (155 lb), SpO2 100 %.  GENERAL:  47 y.o.-year-old patient lying in the bed with no acute distress.  EYES: Pupils equal, round, reactive to light and accommodation. No scleral icterus. Extraocular muscles intact.  HEENT: Head atraumatic, normocephalic. Oropharynx and nasopharynx clear.  NECK:  Supple, no jugular venous distention. No thyroid enlargement, no tenderness.  LUNGS: Normal breath sounds bilaterally, no wheezing, rales,rhonchi or crepitation. No use of accessory muscles of respiration.  CARDIOVASCULAR: S1, S2 normal. No murmurs, rubs, or gallops.  ABDOMEN: Soft, nontender, nondistended. Bowel sounds present. No organomegaly or mass.  EXTREMITIES: No pedal edema, cyanosis, or clubbing.  NEUROLOGIC: Cranial nerves II through XII are intact. Muscle strength 5/5 in all extremities. Sensation intact. Gait not checked.  PSYCHIATRIC: The patient is alert and oriented x  3.  SKIN: No obvious rash, lesion, or ulcer.   LABORATORY PANEL:   CBC Recent Labs  Lab 03/21/18 0230 03/22/18 0330 03/23/18 1428  WBC 12.7* 10.7* 8.4  HGB 14.8 13.8 14.6  HCT 43.1 39.3* 42.2  PLT 281 274 331  MCV 87.6 88.2 87.6  MCH 30.1 31.0 30.4  MCHC 34.3 35.1 34.7  RDW 14.1 14.3 14.3    ------------------------------------------------------------------------------------------------------------------  Chemistries  Recent Labs  Lab 03/21/18 0230 03/22/18 0330 03/23/18 1428  NA 136 135 138  K 3.9 4.0 4.2  CL 101 103 103  CO2 28 26 27   GLUCOSE 129* 97 100*  BUN 17 12 10   CREATININE 0.75 0.58* 0.70  CALCIUM 9.0 8.8* 9.2  AST 47* 27 34  ALT 58* 43 47*  ALKPHOS 71 70 71  BILITOT 0.4 0.5 0.4   ------------------------------------------------------------------------------------------------------------------ estimated creatinine clearance is 106.3 mL/min (by C-G formula based on SCr of 0.7 mg/dL). ------------------------------------------------------------------------------------------------------------------ No results for input(s): TSH, T4TOTAL, T3FREE, THYROIDAB in the last 72 hours.  Invalid input(s): FREET3   Coagulation profile No results for input(s): INR, PROTIME in the last 168 hours. ------------------------------------------------------------------------------------------------------------------- No results for input(s): DDIMER in the last 72 hours. -------------------------------------------------------------------------------------------------------------------  Cardiac Enzymes No results for input(s): CKMB, TROPONINI, MYOGLOBIN in the last 168 hours.  Invalid input(s): CK ------------------------------------------------------------------------------------------------------------------ Invalid input(s): POCBNP  ---------------------------------------------------------------------------------------------------------------  Urinalysis    Component Value Date/Time   COLORURINE YELLOW (A) 03/21/2018 0230   APPEARANCEUR HAZY (A) 03/21/2018 0230   LABSPEC 1.021 03/21/2018 0230   PHURINE 6.0 03/21/2018 0230   GLUCOSEU NEGATIVE 03/21/2018 0230   HGBUR LARGE (A) 03/21/2018 0230   BILIRUBINUR NEGATIVE 03/21/2018 0230   KETONESUR NEGATIVE  03/21/2018 0230   PROTEINUR 30 (A) 03/21/2018 0230   UROBILINOGEN 1.0 07/04/2011 1816   NITRITE NEGATIVE 03/21/2018 0230   LEUKOCYTESUR NEGATIVE 03/21/2018 0230     RADIOLOGY: No results found.  EKG: Orders placed or performed during the hospital encounter of 03/21/18  . ED EKG 12-Lead  . ED EKG 12-Lead  . EKG 12-Lead  . EKG 12-Lead  . EKG 12-Lead  . EKG 12-Lead    IMPRESSION AND PLAN: *Acute Enterobacter bacteremia Secondary to IV drug abuse Left AGAINST MEDICAL ADVICE from our hospital 2 days ago admit to regular nursing for bed, empiric IV cefepime, follow-up on sensitivities, check echocardiogram to evaluate for possible endocarditis, consult infectious disease for length of antibiotic treatment   *Acute on chronic IV drug abuse Check urine drug screen  *Chronic back pain Etiology unknown Placed on adult pain protocol, limit opioid is much as possible  *Chronic tobacco smoking/dependency Nicotine patch with cessation counseling ordered   All the records are reviewed and case discussed with ED provider. Management plans discussed with the patient, family and they are in agreement.  CODE STATUS:full Code Status History    Date Active Date Inactive Code Status Order ID Comments User Context   03/21/2018 0837 03/22/2018 1943 Full Code [id number]  Barbaraann Rondo, MD Inpatient   08/29/2017 2039 08/31/2017 1702 Full Code [id number]  Enid Baas, MD Inpatient       TOTAL TIME TAKING CARE OF THIS PATIENT: 45 minutes.    Evelena Asa Salary M.D on 03/23/2018   Between 7am to 6pm - Pager - [phone]  After 6pm go to [email or web address] - password [email or web address] [facility]  [facility] [place] [facility]  Office  [phone]  CC: Primary care physician; Patient, No Pcp Per   Note: This dictation was prepared with Dragon dictation along with smaller phrase  technology. Any transcriptional errors that result from this process are unintentional.

## 2018-03-23 NOTE — Progress Notes (Addendum)
Pharmacy Antibiotic Note  Barry Wheeler is a 47 y.o. male admitted on 03/23/2018 with bacteremia.  Pharmacy has been consulted for cefepime dosing. He has a history of kidney stones and IV drug use who was admitted to the hospital 2 days ago with sepsis and found to be bacteremic with Enterobacter species.  He left AMA yesterday afternoon due to needing to attend some family affairs.  Last night he continued having fevers chills sweats.  No chest pain or shortness of breath.  He returns today to continue his medical care.``  Plan: Cefepime 2 grams IV every 8 hours  Height: 5\' 6"  (167.6 cm) Weight: 155 lb (70.3 kg) IBW/kg (Calculated) : 63.8  Temp (24hrs), Avg:98.3 F (36.8 C), Min:98.3 F (36.8 C), Max:98.3 F (36.8 C)  Recent Labs  Lab 03/21/18 0230 03/21/18 0449 03/21/18 2103 03/22/18 0330 03/23/18 1428  WBC 12.7*  --   --  10.7* 8.4  CREATININE 0.75  --   --  0.58* 0.70  LATICACIDVEN  --  1.1 1.1  --  1.4    Estimated Creatinine Clearance: 106.3 mL/min (by C-G formula based on SCr of 0.7 mg/dL).    No Known Allergies   Thank you for allowing pharmacy to be a part of this patient's care.  Burnis Medinodney Sheron Robin, PharmD Clinical Pharmacist 03/23/2018

## 2018-03-23 NOTE — ED Notes (Signed)
Pt presentation discussed w/ EDP, Siadecki; see new orders.

## 2018-03-23 NOTE — ED Triage Notes (Signed)
Pt in via POV; pt admitted to hospital on 8/5 due to Sepsis, reports having to leave AMA yesterday due to family emergency.  Pt states, "they told me when I came back I would have to come through the emergency room again."  Pt reports fever last night, taking Tylenol at home.  Vitals WDL at this time.

## 2018-03-24 LAB — HCV RT-PCR, QUANT (NON-GRAPH)
HCV log10: 5.66 log10 IU/mL
HEPATITIS C QUANTITATION: 457000 [IU]/mL

## 2018-03-24 LAB — HCV AB W REFLEX TO QUANT PCR: HCV Ab: >11 s/co ratio — ABNORMAL HIGH (ref 0.0–0.9)

## 2018-03-24 NOTE — Discharge Summary (Signed)
[facility] - [place] at [facility]   PATIENT NAME: Barry Wheeler    MR#:  [id number]  DATE OF BIRTH:  [birth year]  DATE OF ADMISSION:  03/23/2018 ADMITTING PHYSICIAN: Bertrum Sol, MD  DATE OF DISCHARGE: 03/23/2018  7:15 PM  PRIMARY CARE PHYSICIAN: Patient, No Pcp Per    ADMISSION DIAGNOSIS:  IV drug user [F19.90] Bacteremia due to Enterobacter species [R78.81, B96.89]  DISCHARGE DIAGNOSIS:  Active Problems:   Bacteremia   SECONDARY DIAGNOSIS:   Past Medical History:  Diagnosis Date  . Drug abuse (HCC)   . Kidney stones     HOSPITAL COURSE:  Pt left AMA for the second time in 3 days after demands for high strength IV opioids were denied due to opioid-drug seeking behaviors.  *Acute Enterobacter bacteremia Secondary to IV drug abuse Left AGAINST MEDICAL ADVICE from our hospital 2 days prior to admission. Admitted to regular nursing for bed, placed on empiric IV cefepime, echocardiogram ordered to evaluate for possible endocarditis, consulted infectious disease for length of antibiotic treatment   *Acute on chronic IV drug abuse UDS ordered  *Chronic back pain Etiology unknown Placed on adult pain protocol, tried to limit opioids is much as possible due to drug addiction  *Chronic tobacco smoking/dependency Nicotine patch with cessation counseling ordered   DISCHARGE CONDITIONS:   guarded  CONSULTS OBTAINED:    DRUG ALLERGIES:  No Known Allergies  DISCHARGE MEDICATIONS:   Allergies as of 03/23/2018   No Known Allergies     Medication List    ASK your doctor about these medications   acetaminophen 325 MG tablet Commonly known as:  TYLENOL Take 650 mg by mouth every 6 (six) hours as needed.   omega-3 acid ethyl esters 1 g capsule Commonly known as:  LOVAZA Take 1 g by mouth daily.        DISCHARGE INSTRUCTIONS:   If you experience worsening of your admission symptoms, develop shortness of breath, life threatening  emergency, suicidal or homicidal thoughts you must seek medical attention immediately by calling 911 or calling your MD immediately  if symptoms less severe.  You Must read complete instructions/literature along with all the possible adverse reactions/side effects for all the Medicines you take and that have been prescribed to you. Take any new Medicines after you have completely understood and accept all the possible adverse reactions/side effects.   Please note  You were cared for by a hospitalist during your hospital stay. If you have any questions about your discharge medications or the care you received while you were in the hospital after you are discharged, you can call the unit and asked to speak with the hospitalist on call if the hospitalist that took care of you is not available. Once you are discharged, your primary care physician will handle any further medical issues. Please note that NO REFILLS for any discharge medications will be authorized once you are discharged, as it is imperative that you return to your primary care physician (or establish a relationship with a primary care physician if you do not have one) for your aftercare needs so that they can reassess your need for medications and monitor your lab values.    Today   CHIEF COMPLAINT:   Chief Complaint  Patient presents with  . Blood Infection    HISTORY OF PRESENT ILLNESS:  47 y.o. male with a known history per below which includes IV drug abuse, left hospital AGAINST MEDICAL ADVICE 2 days ago with acute  Enterobacter bacteremia, patient presents today to the emergency room now wanting to be treated for his infection, patient complains of low back pain only, patient's mother at the bedside, patient no apparent distress, resting comfortably in bed, patient is now being admitted for acute Enterobacter bacteremia secondary to IV drug abuse.  VITAL SIGNS:  Blood pressure 117/73, pulse 64, temperature 97.9 F (36.6 C),  temperature source Oral, resp. rate 12, height 5\' 6"  (1.676 m), weight 70.3 kg, SpO2 100 %.  I/O:  No intake or output data in the 24 hours ending 03/24/18 1027  PHYSICAL EXAMINATION:  GENERAL:  47 y.o.-year-old patient lying in the bed with no acute distress.  EYES: Pupils equal, round, reactive to light and accommodation. No scleral icterus. Extraocular muscles intact.  HEENT: Head atraumatic, normocephalic. Oropharynx and nasopharynx clear.  NECK:  Supple, no jugular venous distention. No thyroid enlargement, no tenderness.  LUNGS: Normal breath sounds bilaterally, no wheezing, rales,rhonchi or crepitation. No use of accessory muscles of respiration.  CARDIOVASCULAR: S1, S2 normal. No murmurs, rubs, or gallops.  ABDOMEN: Soft, non-tender, non-distended. Bowel sounds present. No organomegaly or mass.  EXTREMITIES: No pedal edema, cyanosis, or clubbing.  NEUROLOGIC: Cranial nerves II through XII are intact. Muscle strength 5/5 in all extremities. Sensation intact. Gait not checked.  PSYCHIATRIC: The patient is alert and oriented x 3.  SKIN: No obvious rash, lesion, or ulcer.   DATA REVIEW:   CBC Recent Labs  Lab 03/23/18 1428  WBC 8.4  HGB 14.6  HCT 42.2  PLT 331    Chemistries  Recent Labs  Lab 03/23/18 1428  NA 138  K 4.2  CL 103  CO2 27  GLUCOSE 100*  BUN 10  CREATININE 0.70  CALCIUM 9.2  AST 34  ALT 47*  ALKPHOS 71  BILITOT 0.4    Cardiac Enzymes No results for input(s): TROPONINI in the last 168 hours.  Microbiology Results  Results for orders placed or performed during the hospital encounter of 03/23/18  Blood culture (single)     Status: None (Preliminary result)   Collection Time: 03/23/18  2:28 PM  Result Value Ref Range Status   Specimen Description BLOOD RIGHT [facility]  Final   Special Requests   Final    BOTTLES DRAWN AEROBIC AND ANAEROBIC Blood Culture adequate volume   Culture   Final    NO GROWTH < 24 HOURS Performed at [facility],  [address] [place], [place] [id number]    Report Status PENDING  Incomplete    RADIOLOGY:  No results found.  EKG:   Orders placed or performed during the hospital encounter of 03/21/18  . ED EKG 12-Lead  . ED EKG 12-Lead  . EKG 12-Lead  . EKG 12-Lead  . EKG 12-Lead  . EKG 12-Lead      Management plans discussed with the patient, family and they are in agreement.  CODE STATUS: full Code Status History    Date Active Date Inactive Code Status Order ID Comments User Context   03/23/2018 1710 03/23/2018 2216 Full Code [id number]  Bertrum SolSalary, Eros Montour D, MD Inpatient   03/21/2018 0837 03/22/2018 1943 Full Code [id number]  Barbaraann RondoSridharan, Prasanna, MD Inpatient   08/29/2017 2039 08/31/2017 1702 Full Code [id number]  Enid BaasKalisetti, Radhika, MD Inpatient      TOTAL TIME TAKING CARE OF THIS PATIENT: 45 minutes.    Evelena AsaMontell D Aroush Chasse M.D on 03/24/2018 at 10:28 AM  Between 7am to 6pm - Pager - [phone]  After 6pm go to  [email or web address] - password [email or web address]  [facility]  Office  [phone]  CC: Primary care physician; Patient, No Pcp Per   Note: This dictation was prepared with Dragon dictation along with smaller phrase technology. Any transcriptional errors that result from this process are unintentional.

## 2018-03-25 MED ORDER — KETOROLAC TROMETHAMINE 30 MG/ML IJ SOLN
30.00 | INTRAMUSCULAR | Status: DC
Start: 2018-03-26 — End: 2018-03-25

## 2018-03-25 MED ORDER — GUAIFENESIN 100 MG/5ML PO SYRP
200.00 | ORAL_SOLUTION | ORAL | Status: DC
Start: ? — End: 2018-03-25

## 2018-03-25 MED ORDER — POLYETHYLENE GLYCOL 3350 17 G PO PACK
17.00 | PACK | ORAL | Status: DC
Start: 2018-03-27 — End: 2018-03-25

## 2018-03-25 MED ORDER — ACETAMINOPHEN 500 MG PO TABS
1000.00 | ORAL_TABLET | ORAL | Status: DC
Start: 2018-03-26 — End: 2018-03-25

## 2018-03-25 MED ORDER — ALUM & MAG HYDROXIDE-SIMETH 400-400-40 MG/5ML PO SUSP
30.00 | ORAL | Status: DC
Start: ? — End: 2018-03-25

## 2018-03-25 MED ORDER — DICLOFENAC SODIUM 1 % TD GEL
2.00 | TRANSDERMAL | Status: DC
Start: 2018-03-26 — End: 2018-03-25

## 2018-03-25 MED ORDER — LIDOCAINE 5 % EX PTCH
2.00 | MEDICATED_PATCH | CUTANEOUS | Status: DC
Start: 2018-03-27 — End: 2018-03-25

## 2018-03-25 MED ORDER — GENERIC EXTERNAL MEDICATION
4.00 | Status: DC
Start: ? — End: 2018-03-25

## 2018-03-25 MED ORDER — MELATONIN 3 MG PO TABS
3.00 | ORAL_TABLET | ORAL | Status: DC
Start: ? — End: 2018-03-25

## 2018-03-25 MED ORDER — GENERIC EXTERNAL MEDICATION
8.00 | Status: DC
Start: ? — End: 2018-03-25

## 2018-03-25 MED ORDER — CYCLOBENZAPRINE HCL 10 MG PO TABS
7.50 | ORAL_TABLET | ORAL | Status: DC
Start: ? — End: 2018-03-25

## 2018-03-25 MED ORDER — CEFEPIME HCL 2 GM/100ML IV SOLN
2.00 | INTRAVENOUS | Status: DC
Start: 2018-03-26 — End: 2018-03-25

## 2018-03-25 MED ORDER — NICOTINE 21 MG/24HR TD PT24
1.00 | MEDICATED_PATCH | TRANSDERMAL | Status: DC
Start: 2018-03-27 — End: 2018-03-25

## 2018-03-26 LAB — CULTURE, BLOOD (ROUTINE X 2)

## 2018-03-26 MED ORDER — LOPERAMIDE HCL 2 MG PO CAPS
2.00 | ORAL_CAPSULE | ORAL | Status: DC
Start: ? — End: 2018-03-26

## 2018-03-26 MED ORDER — LEVOFLOXACIN IN D5W 750 MG/150ML IV SOLN
750.00 | INTRAVENOUS | Status: DC
Start: 2018-03-27 — End: 2018-03-26

## 2018-03-28 LAB — CULTURE, BLOOD (SINGLE)
Culture: NO GROWTH
Special Requests: ADEQUATE

## 2019-05-29 ENCOUNTER — Other Ambulatory Visit: Payer: Self-pay

## 2019-05-29 ENCOUNTER — Emergency Department
Admission: EM | Admit: 2019-05-29 | Discharge: 2019-05-29 | Disposition: A | Discharge status: Home / Self Care | Payer: Self-pay | Attending: Emergency Medicine | Admitting: Emergency Medicine

## 2019-05-29 DIAGNOSIS — R300 Dysuria: Secondary | ICD-10-CM

## 2019-05-29 DIAGNOSIS — R338 Other retention of urine: Secondary | ICD-10-CM

## 2019-05-29 DIAGNOSIS — F17210 Nicotine dependence, cigarettes, uncomplicated: Secondary | ICD-10-CM | POA: Insufficient documentation

## 2019-05-29 DIAGNOSIS — R339 Retention of urine, unspecified: Secondary | ICD-10-CM | POA: Insufficient documentation

## 2019-05-29 LAB — URINALYSIS, COMPLETE (UACMP) WITH MICROSCOPIC
Bacteria, UA: NONE SEEN
Bilirubin Urine: NEGATIVE
Glucose, UA: NEGATIVE mg/dL
Hgb urine dipstick: NEGATIVE
Ketones, ur: NEGATIVE mg/dL
Leukocytes,Ua: NEGATIVE
Nitrite: NEGATIVE
Protein, ur: NEGATIVE mg/dL
Specific Gravity, Urine: 1.019 (ref 1.005–1.030)
pH: 7 (ref 5.0–8.0)

## 2019-05-29 MED ORDER — SULFAMETHOXAZOLE-TRIMETHOPRIM 800-160 MG PO TABS
1.0000 | ORAL_TABLET | Freq: Once | ORAL | Status: AC
  Administered 2019-05-29: 1 via ORAL
  Filled 2019-05-29: qty 1

## 2019-05-29 MED ORDER — TAMSULOSIN HCL 0.4 MG PO CAPS: 0.4000 mg | ORAL_CAPSULE | Freq: Every day | ORAL | 0 refills | Status: AC

## 2019-05-29 MED ORDER — TAMSULOSIN HCL 0.4 MG PO CAPS
0.4000 mg | ORAL_CAPSULE | Freq: Once | ORAL | Status: AC
  Administered 2019-05-29: 0.4 mg via ORAL
  Filled 2019-05-29: qty 1

## 2019-05-29 MED ORDER — SULFAMETHOXAZOLE-TRIMETHOPRIM 800-160 MG PO TABS: 1.0000 | ORAL_TABLET | Freq: Two times a day (BID) | ORAL | 0 refills | Status: AC

## 2019-05-29 NOTE — ED Notes (Signed)
Pt given urine cup for collection when able  

## 2019-05-29 NOTE — Discharge Instructions (Addendum)
You should take the prescription meds as directed. Follow-up with Urology for further treatment to prevent symptoms in the future. You have a pending test, and will only be notified if results are positive and require additional treatment.

## 2019-05-29 NOTE — ED Triage Notes (Addendum)
Pt comes via POV from home with c/o trouble urinating. Pt states this started about 2 weeks ago. Pt states yesterday he only urinated dribbles. Pt states today it was dark orange and burned when he went to bathroom. Pt denies any discharge.  Pt denies any CP, SOB, fever, chills, N/V/D/

## 2019-05-29 NOTE — ED Triage Notes (Signed)
First Nurse Note:  C/O dysuria x 1 day.  States burning with urination, dark urine.  States he feels as if bladder isn't completely  Emptying.

## 2019-05-29 NOTE — ED Notes (Signed)
See triage note  Presents with some urinary problems  States it started about 2 weeks ago  Feels like he needs to use he bathroom but only "dribbles"

## 2019-05-29 NOTE — ED Notes (Signed)
Has not urinated yet.  Bladder scan 161 ml-165 ml.  He will try to go to get urine now.

## 2019-05-29 NOTE — ED Provider Notes (Signed)
[facility] Emergency Department Provider Note ____________________________________________  Time seen: 1815  I have reviewed the triage vital signs and the nursing notes.  HISTORY  Chief Complaint  Dysuria and Urinary Retention  HPI Barry Wheeler is a 48 y.o. male presents himself to the ED for evaluation of urinary retention as well as some dysuria.   Patient reports that his symptoms began about 2 weeks ago he has been having intermittent problems with urination.  He reports the urine comes out and dribbles, he feels that he incompletely empties his bladder.  Today he noted dark-colored urine and burning when he pees.  He has limited his intake of fluids at this time secondary to his concerns over being unable to void completely.  He reports similar episode about 2 years prior, he was evaluated and was advised to follow-up with urology.  He reports taking the antibiotics and bladder medication as prescribed, and with symptoms resolved, he never followed up as directed.  He denies any fevers, chills, or sweats.  He also denies any frank hematuria or penile discharge.  Past Medical History:  Diagnosis Date  . Drug abuse (HCC)   . Kidney stones     Patient Active Problem List   Diagnosis Date Noted  . Bacteremia 03/23/2018  . Sepsis (HCC) 03/21/2018  . Cellulitis 08/29/2017    Past Surgical History:  Procedure Laterality Date  . WRIST SURGERY      Prior to Admission medications   Medication Sig Start Date End Date Taking? Authorizing Provider  acetaminophen (TYLENOL) 325 MG tablet Take 650 mg by mouth every 6 (six) hours as needed.    [provider]  omega-3 acid ethyl esters (LOVAZA) 1 g capsule Take 1 g by mouth daily.    [provider]  sulfamethoxazole-trimethoprim (BACTRIM DS) 800-160 MG tablet Take 1 tablet by mouth 2 (two) times daily. 05/29/19   Allura Doepke, Charlesetta Ivory, PA-C  tamsulosin (FLOMAX) 0.4 MG CAPS capsule Take 1  capsule (0.4 mg total) by mouth daily after breakfast for 14 days. 05/29/19 06/12/19  Maddison Kilner, Charlesetta Ivory, PA-C    Allergies Patient has no known allergies.  Family History  Problem Relation Age of Onset  . Hypertension Mother   . CAD Father     Social History Social History   Tobacco Use  . Smoking status: Current Every Day Smoker    Packs/day: 0.50    Types: Cigarettes  . Smokeless tobacco: Never Used  Substance Use Topics  . Alcohol use: No  . Drug use: Not Currently    Types: Cocaine    Comment: Heroin, Opiods    Review of Systems  Constitutional: Negative for fever. Cardiovascular: Negative for chest pain. Respiratory: Negative for shortness of breath. Gastrointestinal: Negative for abdominal pain, vomiting and diarrhea. Genitourinary: Positive for dysuria. Musculoskeletal: Negative for back pain. Skin: Negative for rash. Neurological: Negative for headaches, focal weakness or numbness. ____________________________________________  PHYSICAL EXAM:  VITAL SIGNS: ED Triage Vitals  Enc Vitals Group     BP 05/29/19 1727 (!) 160/96     Pulse Rate 05/29/19 1727 74     Resp 05/29/19 1727 18     Temp 05/29/19 1727 98 F (36.7 C)     Temp src --      SpO2 05/29/19 1727 99 %     Weight 05/29/19 1728 165 lb (74.8 kg)     Height 05/29/19 1728 5\' 8"  (1.727 m)     Head Circumference --  Peak Flow --      Pain Score 05/29/19 1727 0     Pain Loc --      Pain Edu? --      Excl. in [place]? --     Constitutional: Alert and oriented. Well appearing and in no distress. Head: Normocephalic and atraumatic. Eyes: Conjunctivae are normal. Normal extraocular movements Cardiovascular: Normal rate, regular rhythm. Normal distal pulses. Respiratory: Normal respiratory effort. No wheezes/rales/rhonchi. Gastrointestinal: Soft and nontender. No distention. Musculoskeletal: Nontender with normal range of motion in all extremities.  Neurologic:  Normal gait without ataxia.  Normal speech and language. No gross focal neurologic deficits are appreciated. Skin:  Skin is warm, dry and intact. No rash noted. Psychiatric: Mood and affect are normal. Patient exhibits appropriate insight and judgment. ____________________________________________   LABS (pertinent positives/negatives) Labs Reviewed  URINALYSIS, COMPLETE (UACMP) WITH MICROSCOPIC - Abnormal; Notable for the following components:      Result Value   Color, Urine YELLOW (*)    APPearance CLEAR (*)    All other components within normal limits  CHLAMYDIA/NGC RT PCR ([facility] ONLY)  ____________________________________________  PROCEDURES  Flomax 0.4 mg PO Bladder Scan - 165 ml Bactrim DS i PO Procedures ____________________________________________  INITIAL IMPRESSION / ASSESSMENT AND PLAN / ED COURSE  Patient with ED evaluation of a 2-week complaint of intermittent dysuria and urinary retention.  Patient's clinical picture is benign at this time as he is able to void without difficulty.  He will be discharged with a prescription for Bactrim as well as Flomax.  Symptoms likely represent a BPH versus mild prostatitis.  He is treated empirically for prostatitis, and will be referred to urology for ongoing symptom management.  Patient verbalized understanding of the discharge instructions and will follow up as directed.  GC culture is pending at the time of discharge.  Patient will be notified via telephone if treatment is required.  Barry Wheeler was evaluated in Emergency Department on 05/29/2019 for the symptoms described in the history of present illness. He was evaluated in the context of the global COVID-19 pandemic, which necessitated consideration that the patient might be at risk for infection with the SARS-CoV-2 virus that causes COVID-19. Institutional protocols and algorithms that pertain to the evaluation of patients at risk for COVID-19 are in a state of rapid change based on information  released by regulatory bodies including the CDC and federal and state organizations. These policies and algorithms were followed during the patient's care in the ED. ____________________________________________  FINAL CLINICAL IMPRESSION(S) / ED DIAGNOSES  Final diagnoses:  Acute urinary retention  Dysuria      Melvenia Needles, PA-C 05/29/19 2103    Nance Pear, MD 05/29/19 2106

## 2019-07-24 IMAGING — MR MR THORACIC SPINE WO/W CM
19 of 23 series · 37 of 48 positions shown · IV contrast (multihance)
Comparison: None.

CLINICAL DATA: 46 y/o M; 1 week of back pain and fever. History of
IV drug use.

EXAM:
MRI CERVICAL SPINE WITHOUT AND WITH CONTRAST
MRI THORACIC SPINE WITHOUT AND WITH CONTRAST
MRI LUMBAR SPINE WITHOUT AND WITH CONTRAST
TECHNIQUE: Multisequence MR imaging of the spine from the cervical spine to the
sacrum was performed prior to and following IV contrast
administration for evaluation of spinal metastatic disease.
CONTRAST:  15 cc MultiHance

[Series 5: T2 · sagittal · 4.0mm · 1.02mm/px · 2 of 17 slices shown (1 of 6)]
[im 1/17]
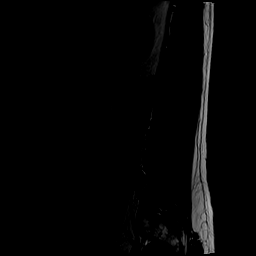
[im 17/17]
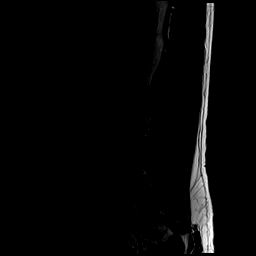

[Series 6: T1 · sagittal · 4.0mm · 1.02mm/px · 2 of 17 slices shown (1 of 6)]
[im 1/17]
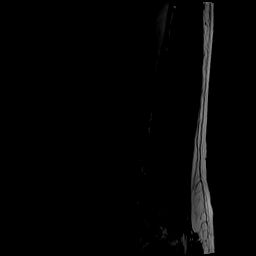
[im 17/17]
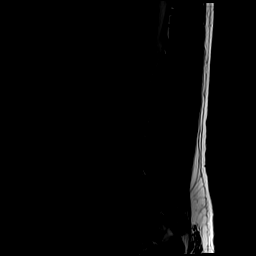

[Series 7: STIR · sagittal · 4.0mm · 1.02mm/px · 2 of 17 slices shown (1 of 3)]
[im 1/17]
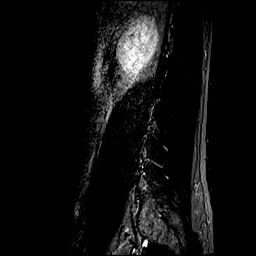
[im 17/17]
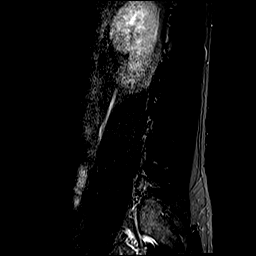

[Series 8: T2 · axial · 4.0mm · 0.78mm/px · z∈[+13,+224]mm · 4 of 38 slices shown (2 of 6)]
[im 1/38]
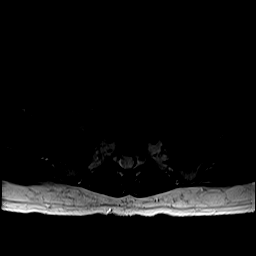
[im 13/38]
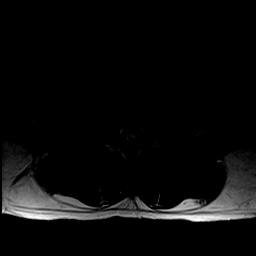
[im 25/38]
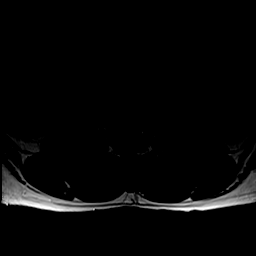
[im 38/38]
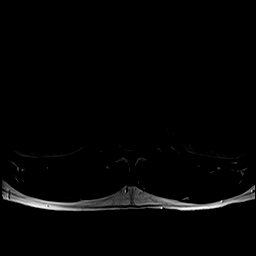

[Series 9: T1 · axial · 4.0mm · 0.39mm/px · z∈[+13,+224]mm · 4 of 38 slices shown (2 of 6)]
[im 1/38]
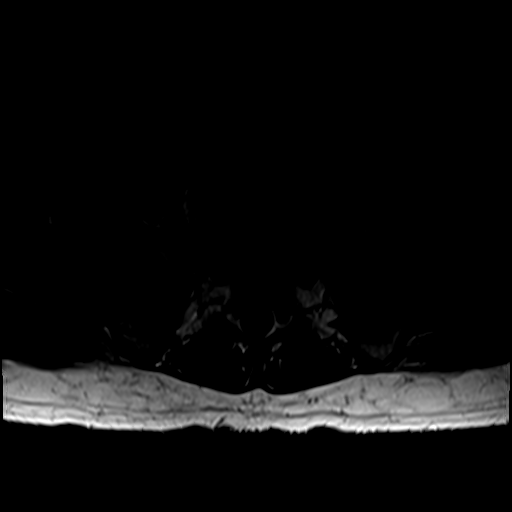
[im 13/38]
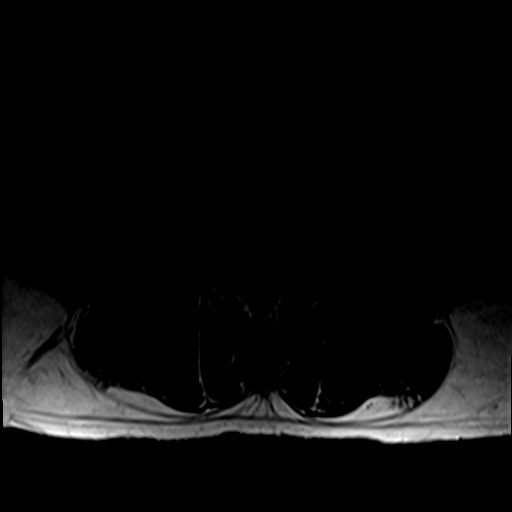
[im 25/38]
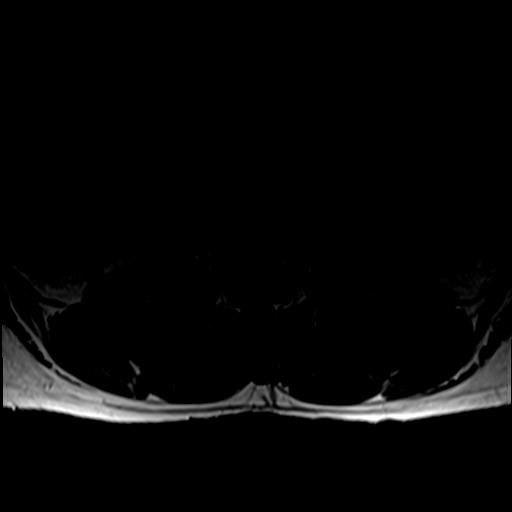
[im 38/38]
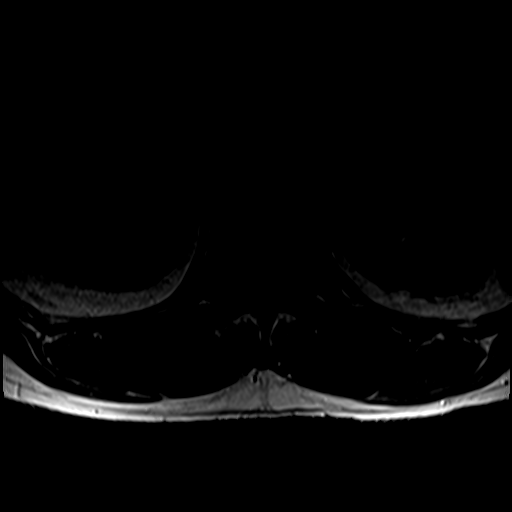

[Series 10: T2 · sagittal · 4.0mm · 1.33mm/px · 2 of 15 slices shown (3 of 6)]
[im 1/15]
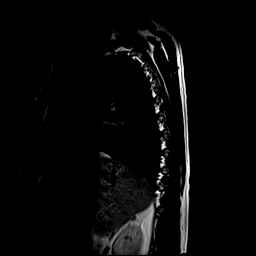
[im 15/15]
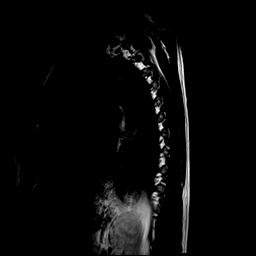

[Series 11: T1 · sagittal · 4.0mm · 1.33mm/px · 2 of 15 slices shown (3 of 6)]
[im 1/15]
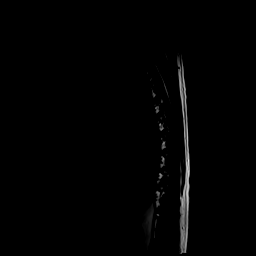
[im 15/15]
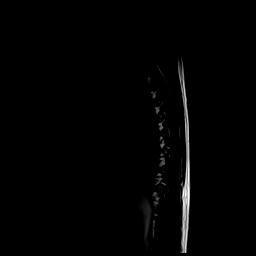

[Series 12: STIR · sagittal · 4.0mm · 1.33mm/px · 1 of 15 slices shown (2 of 3)]
[im 1/15]
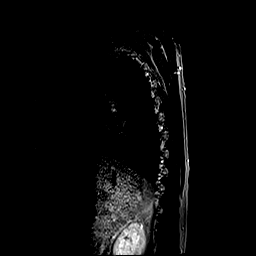

[Series 13: T2 · axial · 5.0mm · 0.86mm/px · z∈[+238,+465]mm · 3 of 40 slices shown (4 of 6)]
[im 1/40]
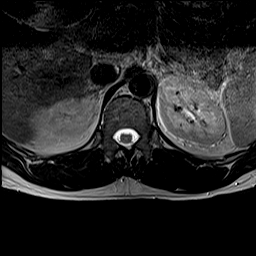
[im 20/40]
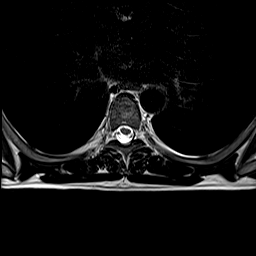
[im 40/40]
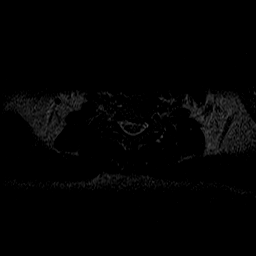

[Series 15: T1 · axial · non-contrast · 5.0mm · 0.43mm/px · z∈[+222,+455]mm · 3 of 40 slices shown (4 of 6)]
[im 1/40]
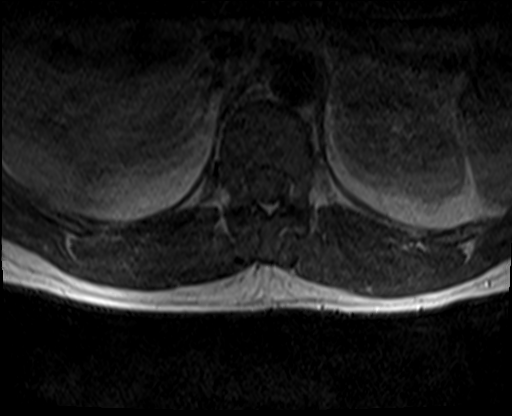
[im 20/40]
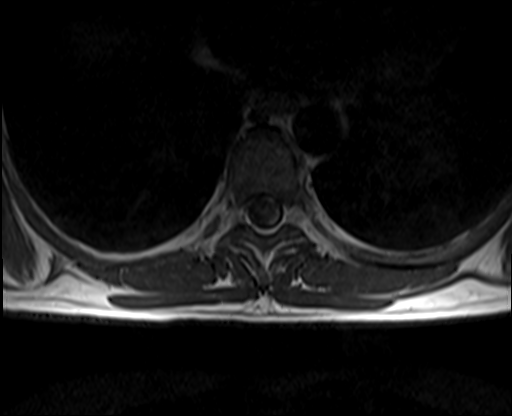
[im 40/40]
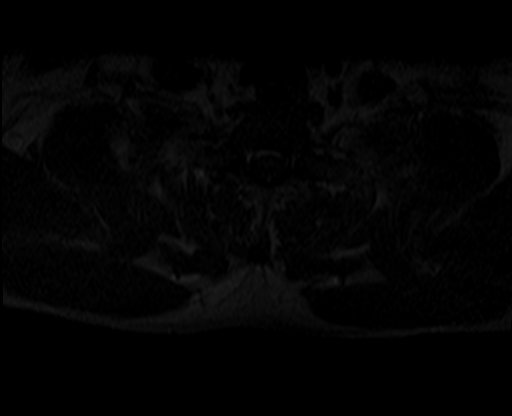

[Series 21: T2 · sagittal · 3.0mm · 0.70mm/px · 1 of 15 slices shown (5 of 6)]
[im 1/15]
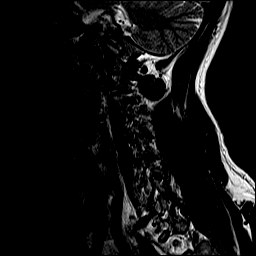

[Series 22: T1 · sagittal · 3.0mm · 0.70mm/px · 1 of 15 slices shown (5 of 6)]
[im 1/15]
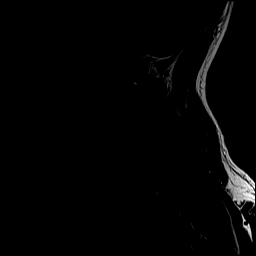

[Series 23: STIR · sagittal · 3.0mm · 0.70mm/px · 1 of 15 slices shown (3 of 3)]
[im 1/15]
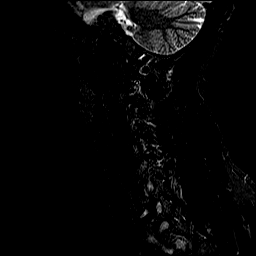

[Series 24: T2 · axial · 3.0mm · 0.70mm/px · z∈[+410,+507]mm · 2 of 27 slices shown (6 of 6)]
[im 1/27]
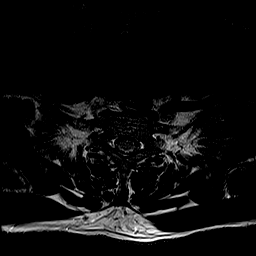
[im 27/27]
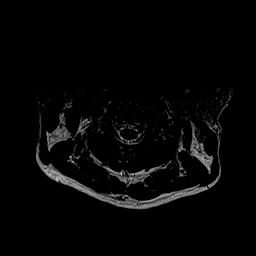

[Series 26: T1 · axial · 3.0mm · 0.70mm/px · z∈[+410,+507]mm · 2 of 27 slices shown (6 of 6)]
[im 1/27]
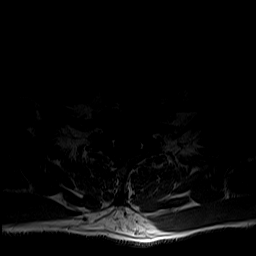
[im 27/27]
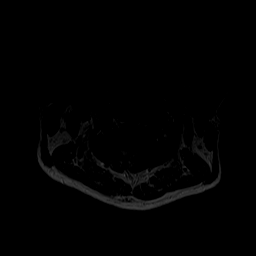

[Series 28: T1 fat-sat post-contrast · sagittal · 4.0mm · 1.02mm/px · 1 of 17 slices shown (1 of 3)]
[im 1/17]
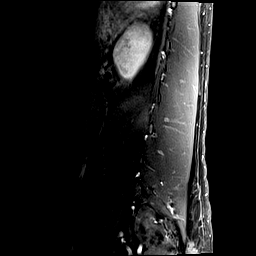

[Series 29: T1 post-contrast · axial · 4.0mm · 0.39mm/px · z∈[+13,+130]mm · 2 of 38 slices shown]
[im 1/38]
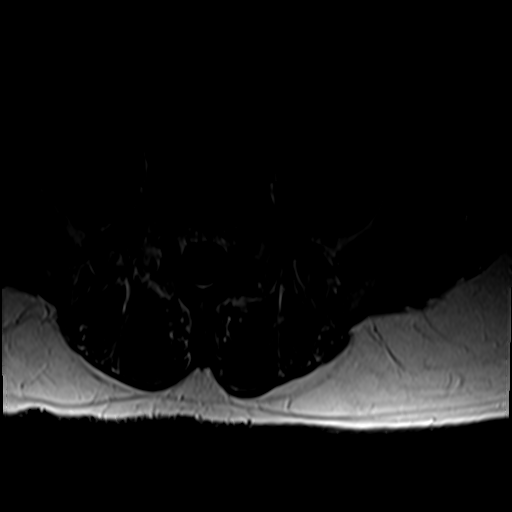
[im 19/38]
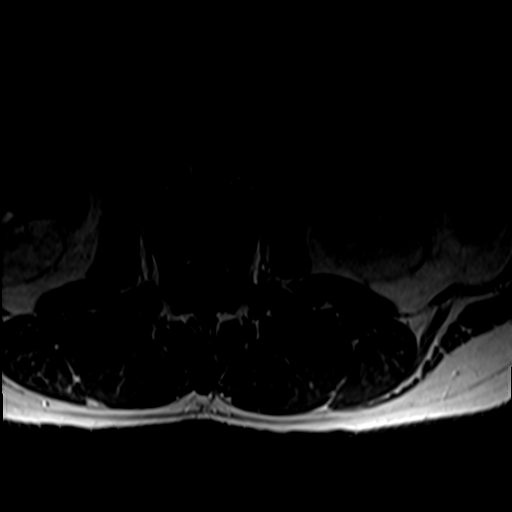

[Series 30: T1 fat-sat post-contrast · sagittal · 4.0mm · 1.33mm/px · 1 of 15 slices shown (2 of 3)]
[im 1/15]
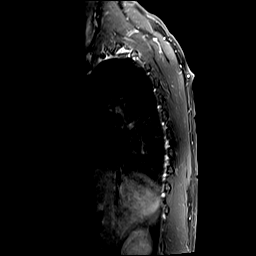

[Series 38: T1 fat-sat post-contrast · sagittal · 3.0mm · 0.73mm/px · 1 of 15 slices shown (3 of 3)]
[im 1/15]
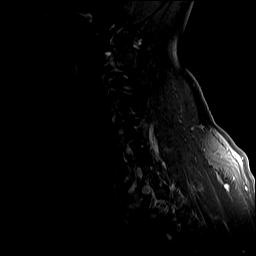

[37 of 48 positions shown; findings below may reference images not displayed]

FINDINGS: MRI CERVICAL SPINE FINDINGS

Alignment: Straightening of cervical lordosis without listhesis.

Vertebrae: No fracture, evidence of discitis, or bone lesion. No
abnormal enhancement.

Cord: Normal signal and morphology.  No abnormal enhancement.

Posterior Fossa, vertebral arteries, paraspinal tissues: Negative.

Disc levels:

C2-3: No significant disc displacement, foraminal stenosis, or canal
stenosis.

C3-4: No significant disc displacement, foraminal stenosis, or canal
stenosis.

C4-5: Mild disc osteophyte complex with bilateral uncovertebral and
facet hypertrophy. Disc contact on the anterior cord with mild cord
flattening and mild canal stenosis. Mild bilateral foraminal
stenosis.

C5-6: Mild disc osteophyte complex with prominent right larger than
left uncovertebral and facet hypertrophy. Moderate right and mild
left foraminal stenosis. No canal stenosis.

C6-7: Mild disc osteophyte complex with left-greater-than-right
uncovertebral and facet hypertrophy. Moderate left and mild right
foraminal stenosis. No canal stenosis.

C7-T1: No significant disc displacement, foraminal stenosis, or
canal stenosis.

MRI THORACIC SPINE FINDINGS

Alignment:  Physiologic.

Vertebrae: No fracture, evidence of discitis, or bone lesion. No
abnormal enhancement.

Cord:  Normal signal and morphology.  No abnormal enhancement.

Paraspinal and other soft tissues: Negative.

Disc levels:

No significant disc displacement, foraminal stenosis, or canal
stenosis.

MRI LUMBAR SPINE FINDINGS

Segmentation:  Standard.

Alignment:  Physiologic.

Vertebrae: No fracture, evidence of discitis, or bone lesion. No
abnormal enhancement.

Conus medullaris: Extends to the L1 level and appears normal.

Paraspinal and other soft tissues: Negative.

Disc levels:

L1-2: No significant disc displacement, foraminal stenosis, or canal
stenosis.

L2-3: Disc desiccation, mild loss of intervertebral disc space
height, and mild disc bulge. No significant foraminal or canal
stenosis.

L3-4: Disc desiccation with mild loss of intervertebral disc space
height, mild disc bulge, and mild facet hypertrophy. Mild
left-greater-than-right neural foraminal stenosis. No significant
canal stenosis.

L4-5: Disc desiccation with moderate loss of intervertebral disc
space height, mild disc bulge, small central disc protrusion, and
facet hypertrophy. Mild bilateral foraminal and lateral recess
stenosis. No significant canal stenosis.

L5-S1: No significant disc displacement, foraminal stenosis, or
canal stenosis.
IMPRESSION: 1. No evidence for discitis osteomyelitis. No bone or cord signal
abnormality.
2. Cervical spondylosis with predominantly discogenic degenerative
changes greatest at the C5-6 level. Multilevel mild and moderate
foraminal stenosis. Mild C4-5 canal stenosis.
3. No significant degenerative changes of the thoracic spine.
4. Lumbar spondylosis with predominantly discogenic degenerative
changes at L2-3 through L4-5. Mild bilateral L3-4 and L4-5 foraminal
stenosis. No high-grade foraminal or canal stenosis.

By: Hamlet Ashton M.D.

## 2020-01-09 ENCOUNTER — Encounter (HOSPITAL_COMMUNITY): Payer: Self-pay | Admitting: Emergency Medicine

## 2020-01-09 ENCOUNTER — Other Ambulatory Visit: Payer: Self-pay

## 2020-01-09 ENCOUNTER — Emergency Department (HOSPITAL_COMMUNITY)
Admission: EM | Admit: 2020-01-09 | Discharge: 2020-01-09 | Disposition: A | Discharge status: Home / Self Care | Payer: Self-pay | Attending: Emergency Medicine | Admitting: Emergency Medicine

## 2020-01-09 DIAGNOSIS — F17210 Nicotine dependence, cigarettes, uncomplicated: Secondary | ICD-10-CM | POA: Insufficient documentation

## 2020-01-09 DIAGNOSIS — S61411A Laceration without foreign body of right hand, initial encounter: Secondary | ICD-10-CM | POA: Insufficient documentation

## 2020-01-09 DIAGNOSIS — Y999 Unspecified external cause status: Secondary | ICD-10-CM | POA: Insufficient documentation

## 2020-01-09 DIAGNOSIS — W268XXA Contact with other sharp object(s), not elsewhere classified, initial encounter: Secondary | ICD-10-CM | POA: Insufficient documentation

## 2020-01-09 DIAGNOSIS — Z79899 Other long term (current) drug therapy: Secondary | ICD-10-CM | POA: Insufficient documentation

## 2020-01-09 DIAGNOSIS — Y929 Unspecified place or not applicable: Secondary | ICD-10-CM | POA: Insufficient documentation

## 2020-01-09 DIAGNOSIS — Y9302 Activity, running: Secondary | ICD-10-CM | POA: Insufficient documentation

## 2020-01-09 DIAGNOSIS — Z23 Encounter for immunization: Secondary | ICD-10-CM | POA: Insufficient documentation

## 2020-01-09 MED ORDER — LIDOCAINE HCL (PF) 1 % IJ SOLN
2.0000 mL | Freq: Once | INTRAMUSCULAR | Status: AC
  Administered 2020-01-09: 2 mL

## 2020-01-09 MED ORDER — TETANUS-DIPHTH-ACELL PERTUSSIS 5-2.5-18.5 LF-MCG/0.5 IM SUSP
0.5000 mL | Freq: Once | INTRAMUSCULAR | Status: AC
  Administered 2020-01-09: 0.5 mL via INTRAMUSCULAR
  Filled 2020-01-09: qty 0.5

## 2020-01-09 NOTE — Discharge Instructions (Addendum)
Please have Mr. Barry Wheeler follow-up with a medical provider in 7 to 10 days for removal of his stitches.

## 2020-01-09 NOTE — ED Notes (Signed)
Patient verbalizes understanding of discharge instructions. Opportunity for questioning and answers were provided. Armband removed by staff, pt discharged from ED in police custody.  °

## 2020-01-09 NOTE — ED Triage Notes (Signed)
Pt in [employer] custody, has wound to right hand, bleeding controlled at this time. Unknown last tetanus shot.

## 2020-01-09 NOTE — ED Provider Notes (Signed)
[name] EMERGENCY DEPARTMENT Provider Note   CSN: [id number] Arrival date & time: 01/09/20  1738     History Chief Complaint  Patient presents with  . Laceration    Barry Wheeler is a 49 y.o. male.  HPI  HPI Comments: Barry Wheeler is a 49 y.o. male who presents to the Emergency Department in [employer] custody complaining of laceration.  Patient was running from the police and when jumping a chain link fence caught his right palm on the top of the fence resulting in a small avulsion.  No active bleeding.  Patient has moderate pain overlying and surrounding the site.  Pain worsens with movement of the right hand and palpation.  No numbness, tingling, weakness     Past Medical History:  Diagnosis Date  . Drug abuse (HCC)   . Kidney stones     Patient Active Problem List   Diagnosis Date Noted  . Bacteremia 03/23/2018  . Sepsis (HCC) 03/21/2018  . Cellulitis 08/29/2017    Past Surgical History:  Procedure Laterality Date  . WRIST SURGERY         Family History  Problem Relation Age of Onset  . Hypertension Mother   . CAD Father     Social History   Tobacco Use  . Smoking status: Current Every Day Smoker    Packs/day: 0.50    Types: Cigarettes  . Smokeless tobacco: Never Used  Substance Use Topics  . Alcohol use: No  . Drug use: Not Currently    Types: Cocaine    Comment: Heroin, Opiods    Home Medications Prior to Admission medications   Medication Sig Start Date End Date Taking? Authorizing Provider  acetaminophen (TYLENOL) 325 MG tablet Take 650 mg by mouth every 6 (six) hours as needed.    [provider]  omega-3 acid ethyl esters (LOVAZA) 1 g capsule Take 1 g by mouth daily.    [provider]  sulfamethoxazole-trimethoprim (BACTRIM DS) 800-160 MG tablet Take 1 tablet by mouth 2 (two) times daily. 05/29/19   Menshew, Charlesetta Ivory, PA-C    Allergies    Patient has no known allergies.  Review of Systems    Review of Systems  Musculoskeletal: Positive for myalgias.  Skin: Positive for wound.  Neurological: Negative for weakness and numbness.    Physical Exam Updated Vital Signs BP 116/68   Pulse 70   Temp 98.9 F (37.2 C) (Oral)   Resp 16   SpO2 98%   Physical Exam Vitals reviewed.  Constitutional:      General: He is not in acute distress.    Appearance: Normal appearance. He is normal weight. He is not ill-appearing, toxic-appearing or diaphoretic.  HENT:     Head: Normocephalic and atraumatic.     Right Ear: External ear normal.     Left Ear: External ear normal.     Nose: Nose normal.  Eyes:     Extraocular Movements: Extraocular movements intact.  Cardiovascular:     Rate and Rhythm: Normal rate.     Pulses: Normal pulses.     Comments: 2+ radial pulses. Pulmonary:     Effort: Pulmonary effort is normal.  Abdominal:     General: Abdomen is flat.  Musculoskeletal:        General: Normal range of motion.     Cervical back: Normal range of motion.  Skin:    General: Skin is warm and dry.     Capillary Refill:  Capillary refill takes less than 2 seconds.     Comments: 2 cm skin avulsion noted to the right palm.  No active bleeding.  Moderate pain overlying the site.  Neurological:     General: No focal deficit present.     Mental Status: He is alert and oriented to person, place, and time.     Comments: Grip strength is 5 out of 5 bilaterally.  Patient can move all the fingers of the right hand without difficulty.  Distal sensation is intact in all fingers of the left hand.  2+ radial pulses.  Good cap refill in all fingers of the right hand.  Psychiatric:        Mood and Affect: Mood normal.        Behavior: Behavior normal.    ED Results / Procedures / Treatments   Labs (all labs ordered are listed, but only abnormal results are displayed) Labs Reviewed - No data to display  EKG None  Radiology No results found.  Procedures .Marland KitchenLaceration Repair   Date/Time: 01/09/2020 10:04 PM Performed by: Placido Sou, PA-C Authorized by: Placido Sou, PA-C   Consent:    Consent obtained:  Verbal   Consent given by:  Patient   Risks discussed:  Infection, need for additional repair, pain, poor cosmetic result and poor wound healing   Alternatives discussed:  No treatment and delayed treatment Universal protocol:    Procedure explained and questions answered to patient or proxy's satisfaction: yes     Relevant documents present and verified: yes     Test results available and properly labeled: yes     Imaging studies available: yes     Required blood products, implants, devices, and special equipment available: yes     Site/side marked: yes     Immediately prior to procedure, a time out was called: yes     Patient identity confirmed:  Verbally with patient Anesthesia (see MAR for exact dosages):    Anesthesia method:  Local infiltration   Local anesthetic:  Lidocaine 1% w/o epi Laceration details:    Location:  Hand   Hand location:  R palm   Length (cm):  2 Repair type:    Repair type:  Simple Pre-procedure details:    Preparation:  Patient was prepped and draped in usual sterile fashion Exploration:    Hemostasis achieved with:  Direct pressure   Wound exploration: wound explored through full range of motion     Contaminated: no   Treatment:    Area cleansed with:  Betadine and soap and water   Amount of cleaning:  Extensive   Visualized foreign bodies/material removed: no   Skin repair:    Repair method:  Sutures   Suture size:  4-0   Suture material:  Prolene   Suture technique:  Simple interrupted   Number of sutures:  3 Approximation:    Approximation:  Close Post-procedure details:    Dressing:  Adhesive bandage and antibiotic ointment   Patient tolerance of procedure:  Tolerated well, no immediate complications   (including critical care time)  Medications Ordered in ED Medications  lidocaine (PF)  (XYLOCAINE) 1 % injection 2 mL (has no administration in time range)  Tdap (BOOSTRIX) injection 0.5 mL (has no administration in time range)    ED Course  I have reviewed the triage vital signs and the nursing notes.  Pertinent labs & imaging results that were available during my care of the patient were reviewed by me and considered  in my medical decision making (see chart for details).    MDM Rules/Calculators/A&P                      Patient is a 49 year old male who presents with a laceration of the right palm.  Wound was closed with 3 simple interrupted sutures.  This occurred while running from the police.  He presented today in [employer] custody.  Unsure of the timing of his last Tdap so this was updated in the emergency department.  Patient and police were given information regarding his need for suture removal in 7 to 10 days.  Discussed proper wound care.  Patient was discharged in police custody.  Vital signs stable.  Patient discharged to home/self care.  Condition at discharge: Stable  Note: Portions of this report may have been transcribed using voice recognition software. Every effort was made to ensure accuracy; however, inadvertent computerized transcription errors may be present.    Final Clinical Impression(s) / ED Diagnoses Final diagnoses:  Laceration of right hand without foreign body, initial encounter    Rx / DC Orders ED Discharge Orders    None       Rayna Sexton, PA-C 01/09/20 2206    Gareth Morgan, MD 01/10/20 1030

## 2020-04-01 ENCOUNTER — Ambulatory Visit: Payer: Self-pay | Attending: Internal Medicine

## 2020-04-01 DIAGNOSIS — Z23 Encounter for immunization: Secondary | ICD-10-CM

## 2020-04-01 NOTE — Progress Notes (Signed)
   Covid-19 Vaccination Clinic  Name:  Barry Wheeler    MRN: [id number] DOB: Sep 28, 1970  04/01/2020  [name] was observed post Covid-19 immunization for 15 minutes without incident. He was provided with Vaccine Information Sheet and instruction to access the V-Safe system.   [name] was instructed to call 911 with any severe reactions post vaccine: Marland Kitchen Difficulty breathing  . Swelling of face and throat  . A fast heartbeat  . A bad rash all over body  . Dizziness and weakness   Immunizations Administered    Name Date Dose VIS Date Route   Pfizer COVID-19 Vaccine 04/01/2020  2:16 PM 0.3 mL 10/11/2018 Intramuscular   Manufacturer: [employer], [employer]   Lot: [id number]   NDC: [address]-8

## 2020-04-29 ENCOUNTER — Ambulatory Visit: Payer: Self-pay

## 2020-06-16 ENCOUNTER — Encounter (HOSPITAL_COMMUNITY): Payer: Self-pay | Admitting: *Deleted

## 2020-06-16 ENCOUNTER — Emergency Department (HOSPITAL_COMMUNITY): Payer: Self-pay

## 2020-06-16 ENCOUNTER — Other Ambulatory Visit: Payer: Self-pay

## 2020-06-16 ENCOUNTER — Emergency Department (HOSPITAL_COMMUNITY)
Admission: EM | Admit: 2020-06-16 | Discharge: 2020-06-17 | Disposition: A | Discharge status: Home / Self Care | Payer: Self-pay | Attending: Emergency Medicine | Admitting: Emergency Medicine

## 2020-06-16 DIAGNOSIS — R079 Chest pain, unspecified: Secondary | ICD-10-CM

## 2020-06-16 DIAGNOSIS — R600 Localized edema: Secondary | ICD-10-CM | POA: Insufficient documentation

## 2020-06-16 DIAGNOSIS — R200 Anesthesia of skin: Secondary | ICD-10-CM

## 2020-06-16 DIAGNOSIS — F17210 Nicotine dependence, cigarettes, uncomplicated: Secondary | ICD-10-CM | POA: Insufficient documentation

## 2020-06-16 NOTE — ED Triage Notes (Signed)
The pt has had mid-back pain for 3 days  He is also c/o rt arm numbness for the same amount of time and he reports that sometimes he drools from the mouth  Moves all extremities

## 2020-06-17 ENCOUNTER — Other Ambulatory Visit: Payer: Self-pay

## 2020-06-17 ENCOUNTER — Emergency Department (HOSPITAL_COMMUNITY): Payer: Self-pay

## 2020-06-17 LAB — TROPONIN I (HIGH SENSITIVITY): Troponin I (High Sensitivity): 4 ng/L (ref <18)

## 2020-06-17 LAB — BASIC METABOLIC PANEL
Anion gap: 12 (ref 5–15)
BUN: 13 mg/dL (ref 6–20)
CO2: 25 mmol/L (ref 22–32)
Calcium: 9.4 mg/dL (ref 8.9–10.3)
Chloride: 100 mmol/L (ref 98–111)
Creatinine, Ser: 0.72 mg/dL (ref 0.61–1.24)
GFR, Estimated: >60 mL/min (ref >60)
Glucose, Bld: 103 mg/dL — ABNORMAL HIGH (ref 70–99)
Potassium: 4.2 mmol/L (ref 3.5–5.1)
Sodium: 137 mmol/L (ref 135–145)

## 2020-06-17 LAB — CBC
HCT: 50.8 % (ref 39.0–52.0)
Hemoglobin: 16.4 g/dL (ref 13.0–17.0)
MCH: 28.6 pg (ref 26.0–34.0)
MCHC: 32.3 g/dL (ref 30.0–36.0)
MCV: 88.7 fL (ref 80.0–100.0)
Platelets: 300 10*3/uL (ref 150–400)
RBC: 5.73 MIL/uL (ref 4.22–5.81)
RDW: 13.7 % (ref 11.5–15.5)
WBC: 7.9 10*3/uL (ref 4.0–10.5)
nRBC: 0 % (ref 0.0–0.2)

## 2020-06-17 MED ORDER — LORAZEPAM 2 MG/ML IJ SOLN
1.0000 mg | Freq: Once | INTRAMUSCULAR | Status: AC
  Administered 2020-06-17: 1 mg via INTRAVENOUS
  Filled 2020-06-17: qty 1

## 2020-06-17 NOTE — ED Provider Notes (Signed)
[facility] EMERGENCY DEPARTMENT Provider Note   CSN: [id number] Arrival date & time: 06/16/20  2247     History Chief Complaint  Patient presents with  . rt arm numbness    Barry Wheeler is a 49 y.o. male.  Patient with history of drug abuse, kidney stones presents with new right arm numbness for 2 days.  Patient's mother is noted he has been drooling out of his mouth and mild slurred speech intermittent for the past 1 to 2 weeks.  Patient has no known stroke or heart disease.  Patient also does not see a physician.  Patient currently has no job and has been living out of his car.  Patient has been smoking for over 30 years.  Patient's had intermittent chest discomfort lasting less than an hour the past few months nonexertional.  No recent chest pain in the past 2 or 3 days.  Patient's had intermittent back pain that resolved after an hour.  No current back discomfort.  Patient denies any neurologic symptoms in his legs.  No weakness in any extremity.        Past Medical History:  Diagnosis Date  . Drug abuse (HCC)   . Kidney stones     Patient Active Problem List   Diagnosis Date Noted  . Bacteremia 03/23/2018  . Sepsis (HCC) 03/21/2018  . Cellulitis 08/29/2017    Past Surgical History:  Procedure Laterality Date  . WRIST SURGERY         Family History  Problem Relation Age of Onset  . Hypertension Mother   . CAD Father     Social History   Tobacco Use  . Smoking status: Current Every Day Smoker    Packs/day: 0.50    Types: Cigarettes  . Smokeless tobacco: Never Used  Vaping Use  . Vaping Use: Never used  Substance Use Topics  . Alcohol use: No  . Drug use: Not Currently    Types: Cocaine    Comment: Heroin, Opiods    Home Medications Prior to Admission medications   Medication Sig Start Date End Date Taking? Authorizing Provider  acetaminophen (TYLENOL) 325 MG tablet Take 650 mg by mouth every 6 (six) hours as needed.     [provider]  omega-3 acid ethyl esters (LOVAZA) 1 g capsule Take 1 g by mouth daily.    [provider]  sulfamethoxazole-trimethoprim (BACTRIM DS) 800-160 MG tablet Take 1 tablet by mouth 2 (two) times daily. 05/29/19   Menshew, Charlesetta Ivory, PA-C    Allergies    Patient has no known allergies.  Review of Systems   Review of Systems  Constitutional: Negative for chills and fever.  HENT: Negative for congestion.   Eyes: Negative for visual disturbance.  Respiratory: Negative for shortness of breath.   Cardiovascular: Positive for chest pain. Negative for leg swelling.  Gastrointestinal: Negative for abdominal pain and vomiting.  Genitourinary: Negative for dysuria and flank pain.  Musculoskeletal: Negative for back pain, neck pain and neck stiffness.  Skin: Negative for rash.  Neurological: Positive for numbness. Negative for light-headedness and headaches.    Physical Exam Updated Vital Signs BP 132/75 (BP Location: Right Arm)   Pulse 64   Temp (!) 97.4 F (36.3 C)   Resp 16   Ht 5\' 7"  (1.702 m)   Wt 74.8 kg   SpO2 98%   BMI 25.83 kg/m   Physical Exam Vitals and nursing note reviewed.  Constitutional:  Appearance: He is well-developed.  HENT:     Head: Normocephalic and atraumatic.     Nose: Nose normal.  Eyes:     General:        Right eye: No discharge.        Left eye: No discharge.     Conjunctiva/sclera: Conjunctivae normal.  Neck:     Trachea: No tracheal deviation.  Cardiovascular:     Rate and Rhythm: Normal rate and regular rhythm.     Pulses: Normal pulses.  Pulmonary:     Effort: Pulmonary effort is normal.     Breath sounds: Normal breath sounds.  Abdominal:     General: There is no distension.     Palpations: Abdomen is soft.     Tenderness: There is no abdominal tenderness. There is no guarding.  Musculoskeletal:        General: Swelling (minimal ankle bilateral) present. No tenderness.     Cervical back: Normal  range of motion and neck supple. No rigidity.     Comments: Patient has no midline or paraspinal thoracic or lumbar tenderness.  Skin:    General: Skin is warm.     Coloration: Skin is not pale.     Findings: No rash.  Neurological:     Mental Status: He is alert and oriented to person, place, and time.     Cranial Nerves: No cranial nerve deficit.     Sensory: Sensory deficit (minimal right dorsal arm) present.     Motor: No weakness.     Comments: Patient has normal 5+ strength upper and lower extremities equal bilateral.  No slurred speech or facial droop at this time.  Finger-nose intact.  Psychiatric:        Mood and Affect: Mood normal.     ED Results / Procedures / Treatments   Labs (all labs ordered are listed, but only abnormal results are displayed) Labs Reviewed  BASIC METABOLIC PANEL - Abnormal; Notable for the following components:      Result Value   Glucose, Bld 103 (*)    All other components within normal limits  CBC  URINALYSIS, ROUTINE W REFLEX MICROSCOPIC  RAPID URINE DRUG SCREEN, HOSP PERFORMED  TROPONIN I (HIGH SENSITIVITY)    EKG EKG Interpretation  Date/Time:  Sunday June 16 2020 22:49:11 EDT Ventricular Rate:  68 PR Interval:  128 QRS Duration: 88 QT Interval:  410 QTC Calculation: 435 R Axis:   62 Text Interpretation: Normal sinus rhythm Minimal voltage criteria for LVH, may be normal variant ( Sokolow-Lyon ) Borderline ECG Confirmed by Marily Memos [phone]) on 06/17/2020 3:32:34 AM   Radiology DG Chest 2 View  Result Date: 06/16/2020 CLINICAL DATA:  Right arm numbness, tobacco abuse EXAM: CHEST - 2 VIEW COMPARISON:  03/21/2018 the FINDINGS: The heart size and mediastinal contours are within normal limits. Both lungs are clear. The visualized skeletal structures are unremarkable. IMPRESSION: No active cardiopulmonary disease. Electronically Signed   By: Helyn Numbers MD   On: 06/16/2020 23:34   MR BRAIN WO CONTRAST  Result Date:  06/17/2020 CLINICAL DATA:  Transient ischemic attack (TIA); right arm numbness, slurred speech. EXAM: MRI HEAD WITHOUT CONTRAST TECHNIQUE: Multiplanar, multiecho pulse sequences of the brain and surrounding structures were obtained without intravenous contrast. COMPARISON:  Head CT 01/18/2018. FINDINGS: Brain: Mild intermittent motion degradation. Cerebral volume is normal for age. No focal parenchymal signal abnormality is identified. There is no acute infarct. No evidence of intracranial mass. No chronic intracranial blood products.  No extra-axial fluid collection. No midline shift. Vascular: Expected proximal arterial flow voids. Skull and upper cervical spine: No focal marrow lesion. Sinuses/Orbits: Visualized orbits show no acute finding. Trace ethmoid and left frontal sinus mucosal thickening. Other: No significant mastoid effusion. IMPRESSION: Mildly motion degraded examination. Unremarkable non-contrast MRI appearance of the brain. No evidence of acute intracranial abnormality. Electronically Signed   By: Jackey Loge DO   On: 06/17/2020 10:04    Procedures Ultrasound ED Peripheral IV (Provider)  Date/Time: 06/17/2020 8:50 AM Performed by: Blane Ohara, MD Authorized by: Blane Ohara, MD   Procedure details:    Indications: multiple failed IV attempts     Angiocath:  20 G   Bedside Ultrasound Guided: Yes     Images: archived     Patient tolerated procedure without complications: Yes     Dressing applied: Yes     (including critical care time)  Medications Ordered in ED Medications  LORazepam (ATIVAN) injection 1 mg (1 mg Intravenous Given 06/17/20 0853)    ED Course  I have reviewed the triage vital signs and the nursing notes.  Pertinent labs & imaging results that were available during my care of the patient were reviewed by me and considered in my medical decision making (see chart for details).    MDM Rules/Calculators/A&P                          Patient presents with  new right arm numbness for approximately 2 days and intermittent slurred speech.  Currently patient only sign or symptom is right arm numbness.  Patient is also had intermittent other symptoms.  Discussed his chronic tobacco use and importance of getting primary care doctor.  Plan for MRI of the brain to look for occult stroke with his cigarette smoking and possible other risk factors that have not been diagnosed due to not having a primary care doctor.  Discussed blood work to check kidney function, basic electrolytes, anemia.  Chest x-ray ordered and reviewed no acute abnormalities.  EKG no acute ischemia appreciated possible LVH. Broad differential diagnosis with multiple different symptoms including coronary artery disease, COPD, musculoskeletal as he has been living in a car, social concerns, history of drug abuse, TIA/stroke, other.   Blood work reviewed normal kidney function, normal hemoglobin, normal electrolytes.  MRI of the brain no acute stroke.  Troponin negative.  Patient stable for close outpatient follow-up with primary care doctor.  Patient low risk heart attack/low hear score.  Chest x-ray no acute abnormalities.     Final Clinical Impression(s) / ED Diagnoses Final diagnoses:  Right arm numbness  Acute chest pain    Rx / DC Orders ED Discharge Orders    None       Blane Ohara, MD 06/17/20 1128

## 2020-06-17 NOTE — TOC Initial Note (Addendum)
Transition of Care [facility]) - Initial/Assessment Note    Patient Details  Name: Barry Wheeler MRN: [id number] Date of Birth: 1971/02/27  [facility] of [facility] CM/SW Contact:    Barry Wheeler, LCSWA Phone Number: 06/17/2020, 10:36 AM  Clinical Narrative:                 TOC CM/CSW contacted pt.  Pt gave mother/Barry Wheeler permission to speak with me.  Pts mother/Barry Wheeler stated that her and pt are currently living out of their car.  They had to leave their home due to black mold.  CSW inquired about them applying for benefits at DSS.  Barry Wheeler replied, "they don't have the gas".  CSW instructed Barry Wheeler to apply for benefits at DSS.  CSW will also reach out to area shelters for availability and update pt on status.  Barry Wheeler, MSW, LCSW-A Pronouns:  She, Her, Hers                  Barry Wheeler ED Transitions of CareClinical Social Worker [email or web address] [phone]        Patient Goals and CMS Choice  Pt would like to find shelter.      Expected Discharge Plan and Services                                                Prior Living Arrangements/Services  Renting a home, currently living out of car.                     Activities of Daily Living      Permission Sought/Granted  From pt to speak with mother/Barry Wheeler.                Emotional Assessment  Alert and oriented.            Admission diagnosis:  Chest Pains, numbness Patient Active Problem List   Diagnosis Date Noted  . Bacteremia 03/23/2018  . Sepsis (HCC) 03/21/2018  . Cellulitis 08/29/2017   PCP:  Patient, No Pcp Per Pharmacy:   [id number] [place], [place] - [address] [place] [address] Phone: [phone] Fax: [phone]  [id number] [place], [place] - [address] [place] [address] Phone: [phone] Fax: [phone]     Social Determinants of Health (SDOH) Interventions     Readmission Risk Interventions No flowsheet data found.

## 2020-06-17 NOTE — ED Notes (Signed)
Patient transported to CT 

## 2020-06-17 NOTE — Progress Notes (Signed)
TOC CM/CSW has contacted the following shelters:  **[facility]-Unable to accept calls at this time.  **[facility]-CSW left HIPPA compliant message with my contact information.  **[facility]Spoke with [employer].  Maggie stated a case manager should be giving them a call this week.  **[facility]-No answer, can't leave message.  **[facility]-CSW left HIPPA compliant message with my contact information.  CSW provided shelter resources to pt and [facility].  CSW will continue to follow for dc needs.  Wania Longstreth Tarpley-Carter, MSW, LCSW-A Pronouns:  She, Her, Hers                  [facility] ED Transitions of CareClinical Social Worker [email or web address] [phone]

## 2020-06-17 NOTE — Discharge Instructions (Addendum)
Please stop smoking. Set appointment with primary care doctor. Return for new concerns. Take aspirin 81 mg once daily until you discuss further with your new doctor.

## 2020-06-17 NOTE — ED Notes (Signed)
Pt reports vision changes, right arm numbness, new stuttering, drooling out of mouth beginning one week ago.

## 2020-10-20 ENCOUNTER — Emergency Department (HOSPITAL_COMMUNITY): Payer: Self-pay

## 2020-10-20 ENCOUNTER — Other Ambulatory Visit: Payer: Self-pay

## 2020-10-20 ENCOUNTER — Emergency Department (HOSPITAL_COMMUNITY)
Admission: EM | Admit: 2020-10-20 | Discharge: 2020-10-20 | Disposition: A | Discharge status: Home / Self Care | Payer: Self-pay | Attending: Emergency Medicine | Admitting: Emergency Medicine

## 2020-10-20 ENCOUNTER — Encounter (HOSPITAL_COMMUNITY): Payer: Self-pay | Admitting: Emergency Medicine

## 2020-10-20 DIAGNOSIS — F17210 Nicotine dependence, cigarettes, uncomplicated: Secondary | ICD-10-CM | POA: Insufficient documentation

## 2020-10-20 DIAGNOSIS — W01198A Fall on same level from slipping, tripping and stumbling with subsequent striking against other object, initial encounter: Secondary | ICD-10-CM | POA: Insufficient documentation

## 2020-10-20 DIAGNOSIS — S2231XA Fracture of one rib, right side, initial encounter for closed fracture: Secondary | ICD-10-CM | POA: Insufficient documentation

## 2020-10-20 MED ORDER — HYDROCODONE-ACETAMINOPHEN 5-325 MG PO TABS: 1.0000 | ORAL_TABLET | ORAL | 0 refills | Status: DC | PRN

## 2020-10-20 MED ORDER — ACETAMINOPHEN 325 MG PO TABS: 650.0000 mg | ORAL_TABLET | Freq: Once | ORAL | Status: DC

## 2020-10-20 MED ORDER — LIDOCAINE 5 % EX PTCH: 1.0000 | MEDICATED_PATCH | CUTANEOUS | Status: DC

## 2020-10-20 MED ORDER — LIDOCAINE 5 % EX PTCH: 1.0000 | MEDICATED_PATCH | CUTANEOUS | 0 refills | Status: AC

## 2020-10-20 MED ORDER — TRAMADOL HCL 50 MG PO TABS: 50.0000 mg | ORAL_TABLET | Freq: Four times a day (QID) | ORAL | 0 refills | Status: DC | PRN

## 2020-10-20 NOTE — ED Triage Notes (Addendum)
Pt slipped and fell getting something out of a dumpster yesterday.  Hit R chest on door of dumpster.  C/o R rib pain.

## 2020-10-20 NOTE — ED Provider Notes (Signed)
[name] EMERGENCY DEPARTMENT Provider Note   CSN: [id number] Arrival date & time: 10/20/20  1437     History Chief Complaint  Patient presents with  . R Rib pain    Barry Wheeler is a 50 y.o. male with past medical history significant for remote substance use who presents for evaluation of right-sided rib pain. Was getting something out of a dumpster and hit right anterior chest wall on the door. Has had some rib pain since then. Pain worse when he palpates his ribs. He denies hitting his head, LOC or anticoagulation. Denies fever, chills, nausea, vomiting, exertional chest pain, SOB, hemoptysis, abdominal pain, diarrhea, dysuria, paresthesias or weakness. Has been taking Tylenol and ibuprofen without relief of his pain. States he had a fracture up to the left many years ago and states this feels similar. Denies additional aggravating or alleviating factors.  History obtained from patient and past medical records. No interpreter used  HPI     Past Medical History:  Diagnosis Date  . Drug abuse (HCC)   . Kidney stones     Patient Active Problem List   Diagnosis Date Noted  . Bacteremia 03/23/2018  . Sepsis (HCC) 03/21/2018  . Cellulitis 08/29/2017    Past Surgical History:  Procedure Laterality Date  . WRIST SURGERY         Family History  Problem Relation Age of Onset  . Hypertension Mother   . CAD Father     Social History   Tobacco Use  . Smoking status: Current Every Day Smoker    Packs/day: 0.50    Types: Cigarettes  . Smokeless tobacco: Never Used  Vaping Use  . Vaping Use: Never used  Substance Use Topics  . Alcohol use: No  . Drug use: Not Currently    Types: Cocaine    Comment: Heroin, Opiods    Home Medications Prior to Admission medications   Medication Sig Start Date End Date Taking? Authorizing Provider  HYDROcodone-acetaminophen (NORCO/VICODIN) 5-325 MG tablet Take 1 tablet by mouth every 4 (four) hours as needed.  10/20/20  Yes Henderly, Britni A, PA-C  lidocaine (LIDODERM) 5 % Place 1 patch onto the skin daily. Remove & Discard patch within 12 hours or as directed by MD 10/20/20  Yes Henderly, Britni A, PA-C  acetaminophen (TYLENOL) 325 MG tablet Take 650 mg by mouth every 6 (six) hours as needed.    [provider]  omega-3 acid ethyl esters (LOVAZA) 1 g capsule Take 1 g by mouth daily.    [provider]  sulfamethoxazole-trimethoprim (BACTRIM DS) 800-160 MG tablet Take 1 tablet by mouth 2 (two) times daily. 05/29/19   Menshew, Charlesetta Ivory, PA-C    Allergies    Patient has no known allergies.  Review of Systems   Review of Systems  Constitutional: Negative.   HENT: Negative.  Negative for congestion.   Respiratory: Negative.   Cardiovascular: Positive for chest pain. Negative for palpitations and leg swelling.       Right chest wall pain  Gastrointestinal: Negative.   Genitourinary: Negative.   Musculoskeletal: Negative.   Skin: Negative.   Neurological: Negative.   All other systems reviewed and are negative.   Physical Exam Updated Vital Signs BP (!) 144/79   Pulse 62   Temp 98.2 F (36.8 C) (Oral)   Resp 18   SpO2 98%   Physical Exam Vitals and nursing note reviewed.  Constitutional:      General: He is  not in acute distress.    Appearance: He is well-developed and well-nourished. He is not ill-appearing, toxic-appearing or diaphoretic.  HENT:     Head: Normocephalic and atraumatic.     Nose: Nose normal.     Mouth/Throat:     Mouth: Mucous membranes are moist.  Eyes:     Pupils: Pupils are equal, round, and reactive to light.  Cardiovascular:     Rate and Rhythm: Normal rate and regular rhythm.     Pulses: Normal pulses.     Heart sounds: Normal heart sounds.  Pulmonary:     Effort: Pulmonary effort is normal. No respiratory distress.     Breath sounds: Normal breath sounds.  Chest:     Chest wall: Tenderness present. No mass, lacerations,  deformity, swelling, crepitus or edema.       Comments: Equal rise and fall to chest wall. Diffuse tenderness to anterior mid ribs. No overlying skin changes Abdominal:     General: Bowel sounds are normal. There is no distension.     Palpations: Abdomen is soft.     Tenderness: There is no abdominal tenderness. There is no right CVA tenderness, left CVA tenderness or guarding.     Hernia: No hernia is present.     Comments: Soft, nontender, specifically no epigastric, right upper quadrant abdominal pain  Musculoskeletal:        General: No swelling or tenderness. Normal range of motion.     Cervical back: Normal range of motion and neck supple.  Skin:    General: Skin is warm and dry.     Capillary Refill: Capillary refill takes less than 2 seconds.     Comments: No abrasions, contusions, ecchymosis, erythema or warmth  Neurological:     General: No focal deficit present.     Mental Status: He is alert and oriented to person, place, and time.  Psychiatric:        Mood and Affect: Mood and affect normal.     ED Results / Procedures / Treatments   Labs (all labs ordered are listed, but only abnormal results are displayed) Labs Reviewed - No data to display  EKG None  Radiology DG Ribs Unilateral W/Chest Right  Result Date: 10/20/2020 CLINICAL DATA:  Acute RIGHT chest and rib pain following fall. Initial encounter. EXAM: RIGHT RIBS AND CHEST - 3+ VIEW COMPARISON:  06/16/2020 and prior studies FINDINGS: A nondisplaced fracture of the anterolateral RIGHT 7th rib is noted. No other acute fracture is identified. Other remote RIGHT rib fractures are present. The cardiomediastinal silhouette is unremarkable. There is no evidence of focal airspace disease, pulmonary edema, suspicious pulmonary nodule/mass, pleural effusion, or pneumothorax. IMPRESSION: Nondisplaced fracture of the anterolateral RIGHT 7th rib. No pneumothorax or other acute abnormality. Electronically Signed   By: Harmon Pier M.D.   On: 10/20/2020 15:16    Procedures Procedures   Medications Ordered in ED Medications  acetaminophen (TYLENOL) tablet 650 mg (has no administration in time range)  lidocaine (LIDODERM) 5 % 1 patch (has no administration in time range)    ED Course  I have reviewed the triage vital signs and the nursing notes.  Pertinent labs & imaging results that were available during my care of the patient were reviewed by me and considered in my medical decision making (see chart for details).  Patient here after mechanical fall which occurred yesterday, hitting right anterior ribs on dumpster door. He has no overlying skin changes. Does have some tenderness to his  right anterior ribs. No hemoptysis. He has no tachycardia, tachypnea or hypoxia. No evidence of infectious changes. No broken skin, does not need updated tetanus. No crepitus, step-off or flail chest. X-ray obtained here does show a nondisplaced seventh rib fracture. His abdomen is soft, nontender. I low suspicion for acute intra-abdominal injury. Will DC home with incentive spirometry, lidocaine patches, pain management. Discussed close follow-up PCP. He is agreeable.  The patient has been appropriately medically screened and/or stabilized in the ED. I have low suspicion for any other emergent medical condition which would require further screening, evaluation or treatment in the ED or require inpatient management.  Patient is hemodynamically stable and in no acute distress.  Patient able to ambulate in department prior to ED.  Evaluation does not show acute pathology that would require ongoing or additional emergent interventions while in the emergency department or further inpatient treatment.  I have discussed the diagnosis with the patient and answered all questions.  Pain is been managed while in the emergency department and patient has no further complaints prior to discharge.  Patient is comfortable with plan discussed in room and  is stable for discharge at this time.  I have discussed strict return precautions for returning to the emergency department.  Patient was encouraged to follow-up with PCP/specialist refer to at discharge.    MDM Rules/Calculators/A&P                           Final Clinical Impression(s) / ED Diagnoses Final diagnoses:  Closed fracture of one rib of right side, initial encounter    Rx / DC Orders ED Discharge Orders         Ordered    HYDROcodone-acetaminophen (NORCO/VICODIN) 5-325 MG tablet  Every 4 hours PRN        10/20/20 1536    lidocaine (LIDODERM) 5 %  Every 24 hours        10/20/20 1536           Henderly, Britni A, PA-C 10/20/20 1541    Cathren Laine, MD 10/20/20 1613    Cathren Laine, MD 10/20/20 [phone]

## 2020-10-20 NOTE — Discharge Instructions (Addendum)
It was our pleasure taking care of you here in the emergency department  Take acetaminophen as need for pain. You may also take ultram as need for pain, aws prescribed. Do not drive or operate heavy machinery while taking pain medicine. This may make you sleepy.  Make sure to use the breathing machine, every 2 hours while awake.  Return for new or worsening symptoms.

## 2020-10-22 ENCOUNTER — Emergency Department (HOSPITAL_COMMUNITY)
Admission: EM | Admit: 2020-10-22 | Discharge: 2020-10-22 | Discharge status: Against Medical Advice | Payer: Self-pay | Attending: Emergency Medicine | Admitting: Emergency Medicine

## 2020-10-22 ENCOUNTER — Emergency Department (HOSPITAL_COMMUNITY): Payer: Self-pay

## 2020-10-22 ENCOUNTER — Encounter (HOSPITAL_COMMUNITY): Payer: Self-pay

## 2020-10-22 DIAGNOSIS — F17210 Nicotine dependence, cigarettes, uncomplicated: Secondary | ICD-10-CM | POA: Insufficient documentation

## 2020-10-22 DIAGNOSIS — R0781 Pleurodynia: Secondary | ICD-10-CM | POA: Insufficient documentation

## 2020-10-22 NOTE — ED Provider Notes (Signed)
[facility] EMERGENCY DEPARTMENT Provider Note   CSN: [id number] Arrival date & time: 10/22/20  1154     History Chief Complaint  Patient presents with  . Rib Injury    Barry Wheeler is a 50 y.o. male.  50 y.o male with a PMH of Kidney stones presents to the ED with a chief complaint of rib pain x 2 days.  Patient was evaluated 2 days ago at [facility], ER and diagnosed with a rib fracture, he was given a prescription for pain medication.  He states he was unable to fill this medication as the pharmacy was out of this stock.  States his medication got switched from hydrocodone to tramadol, the lidocaine patches that he was given also not working for him as they make his skin break out into a rash.  He was evaluated at [facility] yesterday who told him to return to the [facility], ED.  On today's visit he reports the pain was worse this morning, worse with movement.  No shortness of breath, chest pain or other complaints.  The history is provided by the patient and medical records.       Past Medical History:  Diagnosis Date  . Drug abuse (HCC)   . Kidney stones     Patient Active Problem List   Diagnosis Date Noted  . Bacteremia 03/23/2018  . Sepsis (HCC) 03/21/2018  . Cellulitis 08/29/2017    Past Surgical History:  Procedure Laterality Date  . WRIST SURGERY         Family History  Problem Relation Age of Onset  . Hypertension Mother   . CAD Father     Social History   Tobacco Use  . Smoking status: Current Every Day Smoker    Packs/day: 0.50    Types: Cigarettes  . Smokeless tobacco: Never Used  Vaping Use  . Vaping Use: Never used  Substance Use Topics  . Alcohol use: No  . Drug use: Not Currently    Types: Cocaine    Comment: Heroin, Opiods    Home Medications Prior to Admission medications   Medication Sig Start Date End Date Taking? Authorizing Provider  acetaminophen (TYLENOL) 325 MG tablet Take 650 mg by mouth every 6 (six)  hours as needed.    [provider]  lidocaine (LIDODERM) 5 % Place 1 patch onto the skin daily. Remove & Discard patch within 12 hours or as directed by MD 10/20/20   Henderly, Britni A, PA-C  omega-3 acid ethyl esters (LOVAZA) 1 g capsule Take 1 g by mouth daily.    [provider]  sulfamethoxazole-trimethoprim (BACTRIM DS) 800-160 MG tablet Take 1 tablet by mouth 2 (two) times daily. 05/29/19   Menshew, Charlesetta Ivory, PA-C    Allergies    Patient has no known allergies.  Review of Systems   Review of Systems  Constitutional: Negative for fever.  HENT: Negative for sore throat.   Respiratory: Negative for shortness of breath.   Cardiovascular: Negative for chest pain.  Gastrointestinal: Negative for abdominal pain.  Genitourinary: Negative for flank pain.  Musculoskeletal: Positive for myalgias.  Skin: Negative for pallor and wound.  Neurological: Negative for light-headedness.  All other systems reviewed and are negative.   Physical Exam Updated Vital Signs BP (!) 148/83 (BP Location: Right Arm)   Pulse 78   Temp 98.6 F (37 C)   Resp 17   SpO2 98%   Physical Exam Vitals and nursing note reviewed.  Constitutional:      Appearance: Normal appearance.  HENT:     Head: Normocephalic and atraumatic.     Mouth/Throat:     Mouth: Mucous membranes are moist.  Eyes:     Pupils: Pupils are equal, round, and reactive to light.  Cardiovascular:     Rate and Rhythm: Normal rate.  Pulmonary:     Effort: Pulmonary effort is normal.  Abdominal:     General: Abdomen is flat.  Musculoskeletal:     Cervical back: Normal range of motion and neck supple.  Skin:    General: Skin is warm and dry.  Neurological:     Mental Status: He is alert and oriented to person, place, and time.     ED Results / Procedures / Treatments   Labs (all labs ordered are listed, but only abnormal results are displayed) Labs Reviewed - No data to  display  EKG None  Radiology DG Ribs Unilateral W/Chest Right  Result Date: 10/20/2020 CLINICAL DATA:  Acute RIGHT chest and rib pain following fall. Initial encounter. EXAM: RIGHT RIBS AND CHEST - 3+ VIEW COMPARISON:  06/16/2020 and prior studies FINDINGS: A nondisplaced fracture of the anterolateral RIGHT 7th rib is noted. No other acute fracture is identified. Other remote RIGHT rib fractures are present. The cardiomediastinal silhouette is unremarkable. There is no evidence of focal airspace disease, pulmonary edema, suspicious pulmonary nodule/mass, pleural effusion, or pneumothorax. IMPRESSION: Nondisplaced fracture of the anterolateral RIGHT 7th rib. No pneumothorax or other acute abnormality. Electronically Signed   By: Harmon Pier M.D.   On: 10/20/2020 15:16   DG Chest Portable 1 View  Result Date: 10/22/2020 CLINICAL DATA:  Shortness of breath. EXAM: PORTABLE CHEST 1 VIEW COMPARISON:  10/20/2020. FINDINGS: Mediastinum and hilar structures normal. Heart size normal. No focal infiltrate. No pleural effusion or pneumothorax. No acute bony abnormality. IMPRESSION: No acute cardiopulmonary disease. Electronically Signed   By: Maisie Fus  Register   On: 10/22/2020 13:14    Procedures Procedures   Medications Ordered in ED Medications - No data to display  ED Course  I have reviewed the triage vital signs and the nursing notes.  Pertinent labs & imaging results that were available during my care of the patient were reviewed by me and considered in my medical decision making (see chart for details).    MDM Rules/Calculators/A&P  Patient here for pain status post rib fracture x2 days ago.  Extensive chart review which revealed patient was seen at [facility] yesterday, documentation revealed the patient was asking for additional narcotic medication.  PDMP database was reviewed at length, I do not see any medications currently prescribed for patient at this time.  I do see a visit from 2 days ago  from the [facility], ER where she was written hydrocodone along with Lidoderm patches.  Reports the patches have not helped his symptoms.  He is requesting a new prescription, I discussed with patient that I can send a prescription to another pharmacy, however I cannot add an extra pain medication to his current regimen.  He states he has not picked up this medication "tramadol from [facility], because it does not work ".  Mother at the bedside states that this gives patient palpitations.  We discussed treatment with medication, patient during the middle of my exam removed EKG leads, disconnected himself from the monitor and walked out of the ER.  States "it is not that bad, you think I am going to get home with Vicodin ".  I discussed with patient that I am happy to provide her with pain medication but I will need a pharmacy, I have updated the pharmacy that they give me however patient left AGAINST MEDICAL ADVICE prior to completing my evaluation.  Portions of this note were generated with Scientist, clinical (histocompatibility and immunogenetics). Dictation errors may occur despite best attempts at proofreading.  Final Clinical Impression(s) / ED Diagnoses Final diagnoses:  Rib pain    Rx / DC Orders ED Discharge Orders    None       Claude Manges, PA-C 10/22/20 1343    Benjiman Core, MD 10/22/20 1616

## 2020-10-22 NOTE — ED Triage Notes (Signed)
Pt reports on 3/6 he was diagnosed with rib pain. Pt reports that the pain is worse now with breathing. Pt reports the pain is not relieved with pain medication.

## 2020-10-22 NOTE — ED Notes (Signed)
Patient walked out of unit, unable to re-direct. Noted patient ambulated with no issues, NAD.

## 2020-12-03 ENCOUNTER — Emergency Department (HOSPITAL_COMMUNITY)
Admission: EM | Admit: 2020-12-03 | Discharge: 2020-12-04 | Disposition: A | Discharge status: Home / Self Care | Payer: Self-pay | Attending: Emergency Medicine | Admitting: Emergency Medicine

## 2020-12-03 ENCOUNTER — Emergency Department (HOSPITAL_COMMUNITY): Payer: Self-pay

## 2020-12-03 ENCOUNTER — Other Ambulatory Visit: Payer: Self-pay

## 2020-12-03 DIAGNOSIS — R001 Bradycardia, unspecified: Secondary | ICD-10-CM | POA: Insufficient documentation

## 2020-12-03 DIAGNOSIS — R0602 Shortness of breath: Secondary | ICD-10-CM | POA: Insufficient documentation

## 2020-12-03 DIAGNOSIS — R079 Chest pain, unspecified: Secondary | ICD-10-CM | POA: Insufficient documentation

## 2020-12-03 DIAGNOSIS — R42 Dizziness and giddiness: Secondary | ICD-10-CM | POA: Insufficient documentation

## 2020-12-03 DIAGNOSIS — F17210 Nicotine dependence, cigarettes, uncomplicated: Secondary | ICD-10-CM | POA: Insufficient documentation

## 2020-12-03 DIAGNOSIS — K029 Dental caries, unspecified: Secondary | ICD-10-CM | POA: Insufficient documentation

## 2020-12-03 LAB — BASIC METABOLIC PANEL
Anion gap: 7 (ref 5–15)
BUN: 13 mg/dL (ref 6–20)
CO2: 26 mmol/L (ref 22–32)
Calcium: 9 mg/dL (ref 8.9–10.3)
Chloride: 102 mmol/L (ref 98–111)
Creatinine, Ser: 0.83 mg/dL (ref 0.61–1.24)
GFR, Estimated: >60 mL/min (ref >60)
Glucose, Bld: 84 mg/dL (ref 70–99)
Potassium: 3.6 mmol/L (ref 3.5–5.1)
Sodium: 135 mmol/L (ref 135–145)

## 2020-12-03 LAB — CBC
HCT: 47.9 % (ref 39.0–52.0)
Hemoglobin: 15.7 g/dL (ref 13.0–17.0)
MCH: 29.7 pg (ref 26.0–34.0)
MCHC: 32.8 g/dL (ref 30.0–36.0)
MCV: 90.5 fL (ref 80.0–100.0)
Platelets: 260 10*3/uL (ref 150–400)
RBC: 5.29 MIL/uL (ref 4.22–5.81)
RDW: 13.8 % (ref 11.5–15.5)
WBC: 7.4 10*3/uL (ref 4.0–10.5)
nRBC: 0 % (ref 0.0–0.2)

## 2020-12-03 LAB — TROPONIN I (HIGH SENSITIVITY): Troponin I (High Sensitivity): 4 ng/L (ref <18)

## 2020-12-03 NOTE — ED Triage Notes (Signed)
Emergency Medicine Provider Triage Evaluation Note  Barry Wheeler , a 50 y.o. male  was evaluated in triage.  Pt complains of multiple complaints. Mainly SOB, some CP. Feels lightheaded and has times where his vision will become blurry. No syncope. No cardia disease.    Review of Systems  Positive: SOB, cp, blurry vision Negative: Fever  Physical Exam  Ht 5\' 7"  (1.702 m)   Wt 74.8 kg   BMI 25.83 kg/m  Gen:   Awake, no distress   HEENT:  Atraumatic  Resp:  Normal effort  Cardiac:  Normal rate  Abd:   Nondistended, nontender  MSK:   Moves extremities without difficulty  Neuro:  Speech clear   Medical Decision Making  Medically screening exam initiated at 9:42 PM.  Appropriate orders placed.  Barry Wheeler was informed that the remainder of the evaluation will be completed by another provider, this initial triage assessment does not replace that evaluation, and the importance of remaining in the ED until their evaluation is complete.  Clinical Impression  MSE was initiated and I personally evaluated the patient and placed orders (if any) at  9:44 PM on December 03, 2020.  The patient appears stable so that the remainder of the MSE may be completed by another provider.    December 05, 2020, PA-C 12/03/20 2151

## 2020-12-03 NOTE — ED Triage Notes (Addendum)
Multiple complaints - lower back pain (cramping)  Vision problems - blurring x few days. Chest pain (earlier today) and shortness of breath x few days.

## 2020-12-04 LAB — TROPONIN I (HIGH SENSITIVITY): Troponin I (High Sensitivity): 4 ng/L (ref <18)

## 2020-12-04 NOTE — ED Notes (Signed)
Provider at bedside at this time

## 2020-12-04 NOTE — ED Notes (Signed)
Noted pt and family walking down the hallway towards the exit doors

## 2020-12-04 NOTE — ED Notes (Signed)
Provider made aware of same

## 2020-12-04 NOTE — ED Provider Notes (Addendum)
[facility] EMERGENCY DEPARTMENT Provider Note  CSN: [id number] Arrival date & time: 12/03/20 2132  Chief Complaint(s) Dizziness and Chest Pain  HPI Barry Wheeler is a 50 y.o. male here for several days of intermittent lightheadedness and blurry vision noted with standing or changing position.  Patient reports some nausea without emesis.  Denies any alcohol use.  There admits to heroin use but states last time was 1 week ago.  Endorses heavy smoking.  Also reports intermittent chest pain and shortness of breath.  No curtaining chest pain or shortness of breath at this time.  He denies any fevers or chills.  No coughing or congestion.  Patient does report pain to multiple teeth.  No other physical complaints.  HPI  Past Medical History Past Medical History:  Diagnosis Date  . Drug abuse (HCC)   . Kidney stones    Patient Active Problem List   Diagnosis Date Noted  . Bacteremia 03/23/2018  . Sepsis (HCC) 03/21/2018  . Cellulitis 08/29/2017   Home Medication(s) Prior to Admission medications   Medication Sig Start Date End Date Taking? Authorizing Provider  acetaminophen (TYLENOL) 325 MG tablet Take 650 mg by mouth every 6 (six) hours as needed.    [provider]  lidocaine (LIDODERM) 5 % Place 1 patch onto the skin daily. Remove & Discard patch within 12 hours or as directed by MD 10/20/20   Henderly, Britni A, PA-C  omega-3 acid ethyl esters (LOVAZA) 1 g capsule Take 1 g by mouth daily.    [provider]  sulfamethoxazole-trimethoprim (BACTRIM DS) 800-160 MG tablet Take 1 tablet by mouth 2 (two) times daily. 05/29/19   Menshew, Charlesetta Ivory, PA-C                                                                                                                                    Past Surgical History Past Surgical History:  Procedure Laterality Date  . WRIST SURGERY     Family History Family History  Problem Relation Age of Onset  .  Hypertension Mother   . CAD Father     Social History Social History   Tobacco Use  . Smoking status: Current Every Day Smoker    Packs/day: 0.50    Types: Cigarettes  . Smokeless tobacco: Never Used  Vaping Use  . Vaping Use: Never used  Substance Use Topics  . Alcohol use: No  . Drug use: Not Currently    Types: Cocaine    Comment: Heroin, Opiods   Allergies Lidocaine and Tramadol  Review of Systems Review of Systems All other systems are reviewed and are negative for acute change except as noted in the HPI  Physical Exam Vital Signs  I have reviewed the triage vital signs BP (!) 168/82 (BP Location: Right Arm)   Pulse (!) 56   Temp 97.8 F (36.6 C) (Oral)   Resp 20  Ht 5\' 7"  (1.702 m)   Wt 74.8 kg   SpO2 99%   BMI 25.83 kg/m   Physical Exam Vitals reviewed.  Constitutional:      General: He is not in acute distress.    Appearance: He is well-developed. He is not diaphoretic.  HENT:     Head: Normocephalic and atraumatic.     Nose: Nose normal.     Mouth/Throat:     Dentition: Abnormal dentition. Dental tenderness and dental caries present. No dental abscesses.     Pharynx: No posterior oropharyngeal erythema or uvula swelling.     Tonsils: No tonsillar exudate.  Eyes:     General: No scleral icterus.       Right eye: No discharge.        Left eye: No discharge.     Conjunctiva/sclera: Conjunctivae normal.     Pupils: Pupils are equal, round, and reactive to light.  Cardiovascular:     Rate and Rhythm: Normal rate and regular rhythm.     Heart sounds: No murmur heard. No friction rub. No gallop.   Pulmonary:     Effort: Pulmonary effort is normal. No respiratory distress.     Breath sounds: Normal breath sounds. No stridor. No rales.  Abdominal:     General: There is no distension.     Palpations: Abdomen is soft.     Tenderness: There is no abdominal tenderness.  Musculoskeletal:        General: No tenderness.     Cervical back: Normal range  of motion and neck supple.  Skin:    General: Skin is warm and dry.     Findings: No erythema or rash.  Neurological:     Mental Status: He is alert and oriented to person, place, and time.     ED Results and Treatments Labs (all labs ordered are listed, but only abnormal results are displayed) Labs Reviewed  BASIC METABOLIC PANEL  CBC  TROPONIN I (HIGH SENSITIVITY)  TROPONIN I (HIGH SENSITIVITY)                                                                                                                         EKG  EKG Interpretation  Date/Time:  Tuesday December 03 2020 21:42:25 EDT Ventricular Rate:  59 PR Interval:  132 QRS Duration: 84 QT Interval:  418 QTC Calculation: 413 R Axis:   75 Text Interpretation: Sinus bradycardia Otherwise normal ECG No acute changes Confirmed by Drema Pryardama, Jung Yurchak [phone]([id number]) on 12/04/2020 5:04:27 AM      Radiology DG Chest 2 View  Result Date: 12/03/2020 CLINICAL DATA:  Chest pain, shortness of breath EXAM: CHEST - 2 VIEW COMPARISON:  Radiograph 10/22/2020 FINDINGS: Low lung volumes and streaky opacities favoring atelectasis with vascular crowding. No consolidation, features of edema, pneumothorax, or effusion. Pulmonary vascularity is normally distributed. The cardiomediastinal contours are unremarkable. No acute osseous or soft tissue abnormality. IMPRESSION: Low lung volumes and streaky opacities favoring atelectasis. Electronically Signed  By: Kreg Shropshire M.D.   On: 12/03/2020 22:11    Pertinent labs & imaging results that were available during my care of the patient were reviewed by me and considered in my medical decision making (see chart for details).  Medications Ordered in ED Medications - No data to display                                                                                                                                  Procedures Procedures  (including critical care time)  Medical Decision Making / ED Course I have  reviewed the nursing notes for this encounter and the patient's prior records (if available in EHR or on provided paperwork).   Barry Wheeler was evaluated in Emergency Department on 12/04/2020 for the symptoms described in the history of present illness. He was evaluated in the context of the global COVID-19 pandemic, which necessitated consideration that the patient might be at risk for infection with the SARS-CoV-2 virus that causes COVID-19. Institutional protocols and algorithms that pertain to the evaluation of patients at risk for COVID-19 are in a state of rapid change based on information released by regulatory bodies including the CDC and federal and state organizations. These policies and algorithms were followed during the patient's care in the ED.  Patient presents with multiple complaints. Intermittent lightheadedness and blurry vision upon standing. No focal deficits. Consistent with orthostasis, No nausea vomiting or diarrhea, He is hemodynamically stable, Screening labs reassuring without leukocytosis or anemia.  No significant electrolyte derangements or renal sufficiency.  Patient also here with intermittent shortness of breath and chest pain, No current pain now. EKG without acute ischemic changes or evidence of pericarditis. Heart score less than 3. Serial troponins negative x2. Low suspicion for pulmonary embolism. Presentation not classic for aortic dissection or esophageal perforation. Chest x-ray without evidence suggestive of pneumonia, pneumothorax, pneumomediastinum.  No abnormal contour of the mediastinum to suggest dissection. No evidence of acute injuries.  Patient also complaining of multiple dental pain. No evidence of dental abscess on exam. Patient has poor dentition. Offered to treat possible pulpitis with penicillin.   Related to the patient's drug use, offered to provide him with resources for rehab but he declined.  Patient and his mother walked  out before discharge paperwork's were completed.      Final Clinical Impression(s) / ED Diagnoses Final diagnoses:  Dizziness  Intermittent chest pain  Pain due to dental caries   The patient appears reasonably screened and/or stabilized for discharge and I doubt any other medical condition or other [facility] requiring further screening, evaluation, or treatment in the ED at this time prior to discharge. Safe for discharge with strict return precautions.  Disposition: Discharge  Condition: Good  I have discussed the results, Dx and Tx plan with the patient/family who expressed understanding and agree(s) with the plan. Discharge instructions discussed at length. The patient/family was given strict return  precautions who verbalized understanding of the instructions. No further questions at time of discharge.    ED Discharge Orders    None        This chart was dictated using voice recognition software.  Despite best efforts to proofread,  errors can occur which can change the documentation meaning.     Nira Conn, MD 12/04/20 [phone]

## 2020-12-31 ENCOUNTER — Other Ambulatory Visit: Payer: Self-pay

## 2020-12-31 ENCOUNTER — Emergency Department (HOSPITAL_COMMUNITY): Payer: Self-pay

## 2020-12-31 ENCOUNTER — Encounter (HOSPITAL_COMMUNITY): Payer: Self-pay

## 2020-12-31 ENCOUNTER — Emergency Department (HOSPITAL_COMMUNITY)
Admission: EM | Admit: 2020-12-31 | Discharge: 2020-12-31 | Disposition: A | Discharge status: Home / Self Care | Payer: Self-pay | Attending: Emergency Medicine | Admitting: Emergency Medicine

## 2020-12-31 DIAGNOSIS — N200 Calculus of kidney: Secondary | ICD-10-CM | POA: Insufficient documentation

## 2020-12-31 DIAGNOSIS — F17210 Nicotine dependence, cigarettes, uncomplicated: Secondary | ICD-10-CM | POA: Insufficient documentation

## 2020-12-31 LAB — CBC
HCT: 49 % (ref 39.0–52.0)
Hemoglobin: 16 g/dL (ref 13.0–17.0)
MCH: 29.4 pg (ref 26.0–34.0)
MCHC: 32.7 g/dL (ref 30.0–36.0)
MCV: 89.9 fL (ref 80.0–100.0)
Platelets: 329 10*3/uL (ref 150–400)
RBC: 5.45 MIL/uL (ref 4.22–5.81)
RDW: 13.6 % (ref 11.5–15.5)
WBC: 10.3 10*3/uL (ref 4.0–10.5)
nRBC: 0 % (ref 0.0–0.2)

## 2020-12-31 LAB — COMPREHENSIVE METABOLIC PANEL
ALT: 64 U/L — ABNORMAL HIGH (ref 0–44)
AST: 49 U/L — ABNORMAL HIGH (ref 15–41)
Albumin: 3.8 g/dL (ref 3.5–5.0)
Alkaline Phosphatase: 60 U/L (ref 38–126)
Anion gap: 5 (ref 5–15)
BUN: 16 mg/dL (ref 6–20)
CO2: 30 mmol/L (ref 22–32)
Calcium: 9.2 mg/dL (ref 8.9–10.3)
Chloride: 102 mmol/L (ref 98–111)
Creatinine, Ser: 1.02 mg/dL (ref 0.61–1.24)
GFR, Estimated: >60 mL/min (ref >60)
Glucose, Bld: 146 mg/dL — ABNORMAL HIGH (ref 70–99)
Potassium: 4.2 mmol/L (ref 3.5–5.1)
Sodium: 137 mmol/L (ref 135–145)
Total Bilirubin: 0.9 mg/dL (ref 0.3–1.2)
Total Protein: 7.5 g/dL (ref 6.5–8.1)

## 2020-12-31 LAB — URINALYSIS, ROUTINE W REFLEX MICROSCOPIC
Bacteria, UA: NONE SEEN
Bilirubin Urine: NEGATIVE
Glucose, UA: NEGATIVE mg/dL
Ketones, ur: NEGATIVE mg/dL
Leukocytes,Ua: NEGATIVE
Nitrite: NEGATIVE
Protein, ur: 30 mg/dL — AB
RBC / HPF: >50 RBC/hpf — ABNORMAL HIGH (ref 0–5)
Specific Gravity, Urine: 1.015 (ref 1.005–1.030)
pH: 7 (ref 5.0–8.0)

## 2020-12-31 LAB — LIPASE, BLOOD: Lipase: 22 U/L (ref 11–51)

## 2020-12-31 MED ORDER — KETOROLAC TROMETHAMINE 30 MG/ML IJ SOLN
30.0000 mg | Freq: Once | INTRAMUSCULAR | Status: DC
  Filled 2020-12-31: qty 1

## 2020-12-31 MED ORDER — KETOROLAC TROMETHAMINE 30 MG/ML IJ SOLN: 30.0000 mg | Freq: Once | INTRAMUSCULAR | Status: DC

## 2020-12-31 NOTE — ED Triage Notes (Signed)
Pt reports that he woke up with lower abd pain, had a BM without relief, reports problems with constipation. Pain radiates to R flank

## 2020-12-31 NOTE — ED Notes (Signed)
RN reviewed discharge instructions w/ pt and his mom. Follow up and pain management reviewed, pt had no further questions.

## 2020-12-31 NOTE — ED Notes (Addendum)
Pt's sunglasses were found in ED lobby. RN attempted to contact pt w/ phone number listed in chart. RN got a hold of a woman who stated we had the wrong phone number as she was not [place] and did not know a man named [place]. She asked for her number to be removed from his account because she receives calls from the hospital regarding him frequently. Pt only has the one number listed in his chart. Phone number removed per woman's request. Sunglasses were left w/ security in a red lab bag w/ pt's sticker on it.

## 2020-12-31 NOTE — ED Provider Notes (Signed)
[facility] EMERGENCY DEPARTMENT Provider Note   CSN: [id number] Arrival date & time: 12/31/20  0272     History Chief Complaint  Patient presents with  . Abdominal Pain    Barry Wheeler is a 50 y.o. male past medical history of substance abuse, presenting for evaluation of right lower abdominal pain that began this morning.  Patient's mother states he had an episode of emesis last night.  This morning he woke up with sharp severe right lower quadrant abdominal pain radiating around to his right back.  He states he uses IV heroin and used heroin this morning for his pain relief.  Pain is improving, however still present.  Denies any appetite change, urinary symptoms, fever, diarrhea or constipation.  Movement this morning.  He states pain radiates somewhat towards his right testicle.  He denies a history of nephrolithiasis though there is some documentation in past medical history.  The history is provided by the patient.       Past Medical History:  Diagnosis Date  . Drug abuse (HCC)   . Kidney stones     Patient Active Problem List   Diagnosis Date Noted  . Bacteremia 03/23/2018  . Sepsis (HCC) 03/21/2018  . Cellulitis 08/29/2017    Past Surgical History:  Procedure Laterality Date  . WRIST SURGERY         Family History  Problem Relation Age of Onset  . Hypertension Mother   . CAD Father     Social History   Tobacco Use  . Smoking status: Current Every Day Smoker    Packs/day: 0.50    Types: Cigarettes  . Smokeless tobacco: Never Used  Vaping Use  . Vaping Use: Never used  Substance Use Topics  . Alcohol use: No  . Drug use: Not Currently    Types: Cocaine    Comment: Heroin, Opiods    Home Medications Prior to Admission medications   Medication Sig Start Date End Date Taking? Authorizing Provider  acetaminophen (TYLENOL) 325 MG tablet Take 650 mg by mouth every 6 (six) hours as needed.    [provider]  lidocaine  (LIDODERM) 5 % Place 1 patch onto the skin daily. Remove & Discard patch within 12 hours or as directed by MD 10/20/20   Henderly, Britni A, PA-C  omega-3 acid ethyl esters (LOVAZA) 1 g capsule Take 1 g by mouth daily.    [provider]  sulfamethoxazole-trimethoprim (BACTRIM DS) 800-160 MG tablet Take 1 tablet by mouth 2 (two) times daily. 05/29/19   Menshew, Charlesetta Ivory, PA-C    Allergies    Lidocaine and Tramadol  Review of Systems   Review of Systems  Gastrointestinal: Positive for abdominal pain and vomiting.  All other systems reviewed and are negative.   Physical Exam Updated Vital Signs BP 128/71   Pulse 63   Temp 97.6 F (36.4 C) (Oral)   Resp 18   SpO2 95%   Physical Exam Vitals and nursing note reviewed.  Constitutional:      General: He is not in acute distress.    Appearance: He is well-developed. He is not ill-appearing.  HENT:     Head: Normocephalic and atraumatic.  Eyes:     Conjunctiva/sclera: Conjunctivae normal.  Cardiovascular:     Rate and Rhythm: Normal rate and regular rhythm.  Pulmonary:     Effort: Pulmonary effort is normal. No respiratory distress.     Breath sounds: Normal breath sounds.  Abdominal:  General: Bowel sounds are normal.     Palpations: Abdomen is soft.     Tenderness: There is abdominal tenderness in the right lower quadrant. There is no guarding or rebound.  Skin:    General: Skin is warm.  Neurological:     Mental Status: He is alert.  Psychiatric:        Behavior: Behavior normal.     ED Results / Procedures / Treatments   Labs (all labs ordered are listed, but only abnormal results are displayed) Labs Reviewed  COMPREHENSIVE METABOLIC PANEL - Abnormal; Notable for the following components:      Result Value   Glucose, Bld 146 (*)    AST 49 (*)    ALT 64 (*)    All other components within normal limits  URINALYSIS, ROUTINE W REFLEX MICROSCOPIC - Abnormal; Notable for the following components:    Hgb urine dipstick LARGE (*)    Protein, ur 30 (*)    RBC / HPF >50 (*)    All other components within normal limits  LIPASE, BLOOD  CBC    EKG None  Radiology CT Abdomen Pelvis Wo Contrast  Result Date: 12/31/2020 CLINICAL DATA:  Right lower quadrant pain and right flank pain beginning this morning. EXAM: CT ABDOMEN AND PELVIS WITHOUT CONTRAST TECHNIQUE: Multidetector CT imaging of the abdomen and pelvis was performed following the standard protocol without IV contrast. COMPARISON:  03/15/2018 FINDINGS: Lower chest: No acute findings. Hepatobiliary: No mass visualized on this unenhanced exam. Gallbladder is unremarkable. No evidence of biliary ductal dilatation. Pancreas: No mass or inflammatory process visualized on this unenhanced exam. Spleen:  Within normal limits in size. Adrenals/Urinary tract: New mild right renal swelling and perinephric soft tissue stranding is seen, however there is no evidence of urolithiasis or hydronephrosis. This could be due to pyelonephritis or a recently passed urinary calculus. Stomach/Bowel: No evidence of obstruction, inflammatory process, or abnormal fluid collections. Normal appendix visualized. Vascular/Lymphatic: No pathologically enlarged lymph nodes identified. No evidence of abdominal aortic aneurysm. Aortic atherosclerotic calcification noted. Reproductive:  No mass or other significant abnormality. Other:  None. Musculoskeletal:  No suspicious bone lesions identified. IMPRESSION: New mild right renal swelling and perinephric stranding, without no evidence of urolithiasis or hydronephrosis. This could be due to pyelonephritis or a recently passed urinary calculus. Suggest correlation with urinalysis. Electronically Signed   By: Danae Orleans M.D.   On: 12/31/2020 13:54    Procedures Procedures   Medications Ordered in ED Medications  ketorolac (TORADOL) 30 MG/ML injection 30 mg (has no administration in time range)    ED Course  I have reviewed  the triage vital signs and the nursing notes.  Pertinent labs & imaging results that were available during my care of the patient were reviewed by me and considered in my medical decision making (see chart for details).    MDM Rules/Calculators/A&P                          Patient presenting for right lower quadrant abdominal pain that began this morning though significantly improved on evaluation.  He had an episode of nausea vomiting last night.  On examination he is in no distress, minimally tender in the right lower abdomen.  He is afebrile.  Blood work is overall reassuring.  Minimal patient and LFTs though no prior for comparison.  CT scan is consistent with recently passed stone.  His urine has no signs of infection, large hemoglobin.  Normal renal function.  No other abnormal findings on CT scan.  On reevaluation he is well-appearing in no distress without pain.  Provided urology referral for follow-up.  Strict return precautions.  Patient in agreement with plan, discharged in no distress.  Discussed results, findings, treatment and follow up. Patient advised of return precautions. Patient verbalized understanding and agreed with plan.  Final Clinical Impression(s) / ED Diagnoses Final diagnoses:  Kidney stone    Rx / DC Orders ED Discharge Orders    None       Allex Madia, [place] N, PA-C 12/31/20 1421    Pricilla Loveless, MD 01/02/21 [phone]

## 2020-12-31 NOTE — ED Notes (Signed)
Pt refusing cardiac monitoring at this time. Placed on BP and pulse ox monitor.

## 2020-12-31 NOTE — Discharge Instructions (Addendum)
You can take ibuprofen 6 hours as needed for pain. Take the Zofran every 8 hours as needed for nausea. As discussed you may have recently passed a kidney stone.  There are no signs of infection in your urine. Please follow-up outpatient. Return to the emergency department if you have significantly worsening and persisting pain, uncontrollable vomiting, fevers, or other concerning symptoms.

## 2023-07-12 ENCOUNTER — Emergency Department: Payer: Medicaid Other

## 2023-07-12 ENCOUNTER — Encounter: Payer: Self-pay | Admitting: Emergency Medicine

## 2023-07-12 ENCOUNTER — Other Ambulatory Visit: Payer: Self-pay

## 2023-07-12 ENCOUNTER — Emergency Department
Admission: EM | Admit: 2023-07-12 | Discharge: 2023-07-12 | Disposition: A | Discharge status: Home / Self Care | Payer: Medicaid Other | Attending: Emergency Medicine | Admitting: Emergency Medicine

## 2023-07-12 DIAGNOSIS — R1031 Right lower quadrant pain: Secondary | ICD-10-CM | POA: Diagnosis not present

## 2023-07-12 DIAGNOSIS — R300 Dysuria: Secondary | ICD-10-CM | POA: Insufficient documentation

## 2023-07-12 LAB — URINALYSIS, ROUTINE W REFLEX MICROSCOPIC
Bacteria, UA: NONE SEEN
Bilirubin Urine: NEGATIVE
Glucose, UA: NEGATIVE mg/dL
Ketones, ur: 5 mg/dL — AB
Leukocytes,Ua: NEGATIVE
Nitrite: NEGATIVE
Protein, ur: 30 mg/dL — AB
Specific Gravity, Urine: 1.032 — ABNORMAL HIGH (ref 1.005–1.030)
Squamous Epithelial / HPF: 0 /[HPF] (ref 0–5)
pH: 5 (ref 5.0–8.0)

## 2023-07-12 LAB — CBC WITH DIFFERENTIAL/PLATELET
Abs Immature Granulocytes: 0.03 10*3/uL (ref 0.00–0.07)
Basophils Absolute: 0.1 10*3/uL (ref 0.0–0.1)
Basophils Relative: 1 %
Eosinophils Absolute: 0.6 10*3/uL — ABNORMAL HIGH (ref 0.0–0.5)
Eosinophils Relative: 5 %
HCT: 42.6 % (ref 39.0–52.0)
Hemoglobin: 14.8 g/dL (ref 13.0–17.0)
Immature Granulocytes: 0 %
Lymphocytes Relative: 21 %
Lymphs Abs: 2.2 10*3/uL (ref 0.7–4.0)
MCH: 30.7 pg (ref 26.0–34.0)
MCHC: 34.7 g/dL (ref 30.0–36.0)
MCV: 88.4 fL (ref 80.0–100.0)
Monocytes Absolute: 1 10*3/uL (ref 0.1–1.0)
Monocytes Relative: 10 %
Neutro Abs: 6.4 10*3/uL (ref 1.7–7.7)
Neutrophils Relative %: 63 %
Platelets: 249 10*3/uL (ref 150–400)
RBC: 4.82 MIL/uL (ref 4.22–5.81)
RDW: 12.8 % (ref 11.5–15.5)
WBC: 10.3 10*3/uL (ref 4.0–10.5)
nRBC: 0 % (ref 0.0–0.2)

## 2023-07-12 LAB — BASIC METABOLIC PANEL
Anion gap: 9 (ref 5–15)
BUN: 21 mg/dL — ABNORMAL HIGH (ref 6–20)
CO2: 25 mmol/L (ref 22–32)
Calcium: 8.7 mg/dL — ABNORMAL LOW (ref 8.9–10.3)
Chloride: 104 mmol/L (ref 98–111)
Creatinine, Ser: 0.67 mg/dL (ref 0.61–1.24)
GFR, Estimated: >60 mL/min (ref >60)
Glucose, Bld: 93 mg/dL (ref 70–99)
Potassium: 3.6 mmol/L (ref 3.5–5.1)
Sodium: 138 mmol/L (ref 135–145)

## 2023-07-12 MED ORDER — IOHEXOL 300 MG/ML  SOLN
100.0000 mL | Freq: Once | INTRAMUSCULAR | Status: AC | PRN
  Administered 2023-07-12: 100 mL via INTRAVENOUS

## 2023-07-12 NOTE — ED Provider Notes (Signed)
[facility] Provider Note    Event Date/Time   First MD Initiated Contact with Patient 07/12/23 1434     (approximate)   History   Dysuria   HPI  KARAC Barry Wheeler is a 52 y.o. male who presents today for evaluation of dysuria and right lower quadrant pain.  Patient reports that this is been going on for the last couple of days.  He has not noticed any blood in his urine.  No nausea or vomiting.  No flank pain or back pain.  Denies any burning when he urinates.  Patient Active Problem List   Diagnosis Date Noted   Bacteremia 03/23/2018   Sepsis (HCC) 03/21/2018   Cellulitis 08/29/2017          Physical Exam   Triage Vital Signs: ED Triage Vitals  Encounter Vitals Group     BP 07/12/23 1325 (!) 173/110     Systolic BP Percentile --      Diastolic BP Percentile --      Pulse Rate 07/12/23 1325 78     Resp 07/12/23 1325 18     Temp 07/12/23 1325 98.4 F (36.9 C)     Temp Source 07/12/23 1325 Oral     SpO2 07/12/23 1325 97 %     Weight 07/12/23 1326 170 lb (77.1 kg)     Height 07/12/23 1326 5\' 6"  (1.676 m)     Head Circumference --      Peak Flow --      Pain Score 07/12/23 1326 0     Pain Loc --      Pain Education --      Exclude from Growth Chart --     Most recent vital signs: Vitals:   07/12/23 1325  BP: (!) 173/110  Pulse: 78  Resp: 18  Temp: 98.4 F (36.9 C)  SpO2: 97%    Physical Exam Vitals and nursing note reviewed.  Constitutional:      General: Awake and alert. No acute distress.    Appearance: Normal appearance. The patient is normal weight.  HENT:     Head: Normocephalic and atraumatic.     Mouth: Mucous membranes are moist.  Eyes:     General: PERRL. Normal EOMs        Right eye: No discharge.        Left eye: No discharge.     Conjunctiva/sclera: Conjunctivae normal.  Cardiovascular:     Rate and Rhythm: Normal rate and regular rhythm.     Pulses: Normal pulses.  Pulmonary:     Effort: Pulmonary effort  is normal. No respiratory distress.     Breath sounds: Normal breath sounds.  Abdominal:     Abdomen is soft. There is RLQ pain abdominal tenderness. No rebound or guarding. No distention. No CVAT Musculoskeletal:        General: No swelling. Normal range of motion.     Cervical back: Normal range of motion and neck supple.  Skin:    General: Skin is warm and dry.     Capillary Refill: Capillary refill takes less than 2 seconds.     Findings: No rash.  Neurological:     Mental Status: The patient is awake and alert.      ED Results / Procedures / Treatments   Labs (all labs ordered are listed, but only abnormal results are displayed) Labs Reviewed  URINALYSIS, ROUTINE W REFLEX MICROSCOPIC - Abnormal; Notable for the following components:  Result Value   Color, Urine YELLOW (*)    APPearance HAZY (*)    Specific Gravity, Urine 1.032 (*)    Hgb urine dipstick MODERATE (*)    Ketones, ur 5 (*)    Protein, ur 30 (*)    All other components within normal limits  BASIC METABOLIC PANEL - Abnormal; Notable for the following components:   BUN 21 (*)    Calcium 8.7 (*)    All other components within normal limits  CBC WITH DIFFERENTIAL/PLATELET - Abnormal; Notable for the following components:   Eosinophils Absolute 0.6 (*)    All other components within normal limits  URINE CULTURE     EKG     RADIOLOGY I independently reviewed and interpreted imaging and agree with radiologists findings.     PROCEDURES:  Critical Care performed:   Procedures   MEDICATIONS ORDERED IN ED: Medications  iohexol (OMNIPAQUE) 300 MG/ML solution 100 mL (100 mLs Intravenous Contrast Given 07/12/23 1702)     IMPRESSION / MDM / ASSESSMENT AND PLAN / ED COURSE  I reviewed the triage vital signs and the nursing notes.   Differential diagnosis includes, but is not limited to, nephrolithiasis, appendicitis, prostatomegaly.  Patient is awake and alert, hemodynamically stable and  afebrile.  He is nontoxic in appearance.  I reviewed the patient's chart.  Patient was most recently seen in 2022 at which time he also presented with right lower quadrant pain, and CT revealed a "recently passed stone."  Urinalysis obtained in triage reveals RBCs in his urine.  Further workup is indicated.  IV was established and labs are obtained, and I recommend a CT scan given his right lower quadrant tenderness.  Labs obtained are overall reassuring.  Urinalysis does not suggest infection, though does reveal RBCs.  CT scan obtained does not reveal any acute intra-abdominal abnormality.  It is possible that he is experiencing prostatic megaly and I recommended outpatient follow-up with urology.  Upon reevaluation, patient reports that his pain has resolved and he would like to go home.  He understands return precautions and the importance of close outpatient follow-up.  Patient understands and agrees with plan.  He was discharged in stable condition.   Patient's presentation is most consistent with acute complicated illness / injury requiring diagnostic workup.    FINAL CLINICAL IMPRESSION(S) / ED DIAGNOSES   Final diagnoses:  Dysuria     Rx / DC Orders   ED Discharge Orders     None        Note:  This document was prepared using Dragon voice recognition software and may include unintentional dictation errors.   Keturah Shavers 07/12/23 1809    Minna Antis, MD 07/13/23 2227

## 2023-07-12 NOTE — ED Triage Notes (Signed)
Patient to ED via POV for dysuria. States he sometime has trouble urinating but other times no problem. States going more frequent than normal. Denies burning with urination.

## 2023-07-12 NOTE — Discharge Instructions (Addendum)
Your urine, CT scan, and blood work appears to be normal.  Please follow-up with urology.  Please return for any new, worsening, or change in symptoms or other concerns.  It was a pleasure caring for you today.

## 2023-07-13 LAB — URINE CULTURE: Culture: NO GROWTH

## 2023-09-27 ENCOUNTER — Other Ambulatory Visit: Payer: Self-pay

## 2023-09-27 DIAGNOSIS — B80 Enterobiasis: Secondary | ICD-10-CM | POA: Diagnosis not present

## 2023-09-27 NOTE — ED Triage Notes (Signed)
 Pt reports his roommate has a parasite and so does her dog, pt reports when he blows his nose and has a BM he notices worms.

## 2023-09-28 ENCOUNTER — Emergency Department
Admission: EM | Admit: 2023-09-28 | Discharge: 2023-09-28 | Disposition: A | Discharge status: Home / Self Care | Payer: Medicaid Other | Attending: Emergency Medicine | Admitting: Emergency Medicine

## 2023-09-28 DIAGNOSIS — B80 Enterobiasis: Secondary | ICD-10-CM

## 2023-09-28 MED ORDER — ONDANSETRON HCL 4 MG PO TABS: 4.0000 mg | ORAL_TABLET | Freq: Three times a day (TID) | ORAL | 0 refills | Status: AC | PRN

## 2023-09-28 MED ORDER — ALBENDAZOLE 200 MG PO TABS: 400.0000 mg | ORAL_TABLET | ORAL | 0 refills | Status: AC

## 2023-09-28 NOTE — ED Notes (Signed)
No answer when called several times from lobby

## 2023-09-28 NOTE — Discharge Instructions (Signed)
Please pick up the antibiotic from your pharmacy.  You will take 1 dose at this time and then 1 dose 2 weeks from now to clear up the infection.  You can take the nausea medication as needed to help with hydration.

## 2023-09-28 NOTE — ED Provider Notes (Signed)
[facility] Provider Note    Event Date/Time   First MD Initiated Contact with Patient 09/28/23 0330     (approximate)   History   Medical Clearance   HPI Barry Wheeler is a 53 y.o. male presenting today for worms in his stool.  Patient states he lives with his roommate as well as their dog and dog was found to have pinworms and started on medication.  Then stated that his roommate started to have worms noted in their stool and had to be started on medication to treat them.  He states over the past couple days he has seen a white worms in his stool that is similar to their symptoms.  Has had slight nausea but no vomiting.  Otherwise denies abdominal pain.  No diarrhea.  No other systemic symptoms.     Physical Exam   Triage Vital Signs: ED Triage Vitals  Encounter Vitals Group     BP 09/27/23 2306 (!) 190/107     Systolic BP Percentile --      Diastolic BP Percentile --      Pulse Rate 09/27/23 2306 73     Resp 09/27/23 2306 18     Temp 09/27/23 2307 98.3 F (36.8 C)     Temp Source 09/27/23 2307 Oral     SpO2 09/27/23 2306 100 %     Weight 09/27/23 2305 175 lb (79.4 kg)     Height 09/27/23 2305 5\' 6"  (1.676 m)     Head Circumference --      Peak Flow --      Pain Score 09/27/23 2305 0     Pain Loc --      Pain Education --      Exclude from Growth Chart --     Most recent vital signs: Vitals:   09/27/23 2307 09/28/23 0415  BP:  (!) 151/93  Pulse:  77  Resp:  17  Temp: 98.3 F (36.8 C) 98 F (36.7 C)  SpO2:  99%   I have reviewed the vital signs. General:  Awake, alert, no acute distress. Head:  Normocephalic, Atraumatic. EENT:  PERRL, EOMI, Oral mucosa pink and moist, Neck is supple. Cardiovascular: Regular rate, 2+ distal pulses. Respiratory:  Normal respiratory effort, symmetrical expansion, no distress.   Extremities:  Moving all four extremities through full ROM without pain.   Neuro:  Alert and oriented.  Interacting  appropriately.   Skin:  Warm, dry, no rash.   Psych: Appropriate affect.    ED Results / Procedures / Treatments   Labs (all labs ordered are listed, but only abnormal results are displayed) Labs Reviewed - No data to display   EKG    RADIOLOGY    PROCEDURES:  Critical Care performed: No  Procedures   MEDICATIONS ORDERED IN ED: Medications - No data to display   IMPRESSION / MDM / ASSESSMENT AND PLAN / ED COURSE  I reviewed the triage vital signs and the nursing notes.                              Differential diagnosis includes, but is not limited to, Enterobius, parasitic infection  Patient's presentation is most consistent with acute complicated illness / injury requiring diagnostic workup.  Patient is a 53 year old male presenting today over concerns for worms in his stool.  He is able to verify that he has had white worms in his stool.  Do not think he needs tape test here at this time for verification given roommate at home with similar symptoms which resolved with use of medication.  Vital signs are otherwise stable and physical exam unremarkable.  Will empirically treat with albendazole for 2 doses over the next 2 weeks.  Will give Zofran as well to help with nausea symptoms.  Otherwise stable and does not need further workup at this time.  Patient agreeable with this plan and given strict return precautions.     FINAL CLINICAL IMPRESSION(S) / ED DIAGNOSES   Final diagnoses:  Pinworms     Rx / DC Orders   ED Discharge Orders          Ordered    ondansetron (ZOFRAN) 4 MG tablet  Every 8 hours PRN        09/28/23 0411    albendazole (ALBENZA) 200 MG tablet  Every 14 days       Note to Pharmacy: Patient will take 1 dose today and then will take a repeat dose in 2 weeks.   09/28/23 0411             Note:  This document was prepared using Dragon voice recognition software and may include unintentional dictation errors.   Janith Lima,  MD 09/28/23 [phone]

## 2024-02-13 ENCOUNTER — Emergency Department
Admission: EM | Admit: 2024-02-13 | Discharge: 2024-02-13 | Disposition: A | Discharge status: Home / Self Care | Attending: Emergency Medicine | Admitting: Emergency Medicine

## 2024-02-13 ENCOUNTER — Emergency Department

## 2024-02-13 DIAGNOSIS — D72829 Elevated white blood cell count, unspecified: Secondary | ICD-10-CM | POA: Insufficient documentation

## 2024-02-13 DIAGNOSIS — B839 Helminthiasis, unspecified: Secondary | ICD-10-CM | POA: Diagnosis present

## 2024-02-13 DIAGNOSIS — E876 Hypokalemia: Secondary | ICD-10-CM | POA: Diagnosis not present

## 2024-02-13 DIAGNOSIS — B356 Tinea cruris: Secondary | ICD-10-CM | POA: Diagnosis not present

## 2024-02-13 DIAGNOSIS — I6523 Occlusion and stenosis of bilateral carotid arteries: Secondary | ICD-10-CM | POA: Diagnosis not present

## 2024-02-13 LAB — CBC WITH DIFFERENTIAL/PLATELET
Abs Immature Granulocytes: 0.04 10*3/uL (ref 0.00–0.07)
Basophils Absolute: 0 10*3/uL (ref 0.0–0.1)
Basophils Relative: 0 %
Eosinophils Absolute: 0.2 10*3/uL (ref 0.0–0.5)
Eosinophils Relative: 1 %
HCT: 49.5 % (ref 39.0–52.0)
Hemoglobin: 16.7 g/dL (ref 13.0–17.0)
Immature Granulocytes: 0 %
Lymphocytes Relative: 13 %
Lymphs Abs: 1.4 10*3/uL (ref 0.7–4.0)
MCH: 30 pg (ref 26.0–34.0)
MCHC: 33.7 g/dL (ref 30.0–36.0)
MCV: 88.9 fL (ref 80.0–100.0)
Monocytes Absolute: 0.7 10*3/uL (ref 0.1–1.0)
Monocytes Relative: 6 %
Neutro Abs: 8.9 10*3/uL — ABNORMAL HIGH (ref 1.7–7.7)
Neutrophils Relative %: 80 %
Platelets: 270 10*3/uL (ref 150–400)
RBC: 5.57 MIL/uL (ref 4.22–5.81)
RDW: 12.7 % (ref 11.5–15.5)
WBC: 11.3 10*3/uL — ABNORMAL HIGH (ref 4.0–10.5)
nRBC: 0 % (ref 0.0–0.2)

## 2024-02-13 LAB — COMPREHENSIVE METABOLIC PANEL WITH GFR
ALT: 38 U/L (ref 0–44)
AST: 36 U/L (ref 15–41)
Albumin: 4 g/dL (ref 3.5–5.0)
Alkaline Phosphatase: 62 U/L (ref 38–126)
Anion gap: 10 (ref 5–15)
BUN: 20 mg/dL (ref 6–20)
CO2: 26 mmol/L (ref 22–32)
Calcium: 9.3 mg/dL (ref 8.9–10.3)
Chloride: 103 mmol/L (ref 98–111)
Creatinine, Ser: 0.83 mg/dL (ref 0.61–1.24)
GFR, Estimated: >60 mL/min (ref >60)
Glucose, Bld: 114 mg/dL — ABNORMAL HIGH (ref 70–99)
Potassium: 3.4 mmol/L — ABNORMAL LOW (ref 3.5–5.1)
Sodium: 139 mmol/L (ref 135–145)
Total Bilirubin: 0.5 mg/dL (ref 0.0–1.2)
Total Protein: 7.8 g/dL (ref 6.5–8.1)

## 2024-02-13 MED ORDER — KETOCONAZOLE 2 % EX CREA: 1.0000 | TOPICAL_CREAM | Freq: Every day | CUTANEOUS | 0 refills | Status: AC

## 2024-02-13 NOTE — ED Provider Notes (Signed)
 [facility] Provider Note    Event Date/Time   First MD Initiated Contact with Patient 02/13/24 1641     (approximate)   History   Parasite   HPI  Barry Wheeler is a 53 y.o. male with history of drug abuse, nephrolithiasis, presenting with worms in his stool.  Patient states that he was treated for pinworms several months ago.  Has a room that was rented out to go with the dog who he says is positive for tapeworms recently.  He states that he thinks he also has tapeworms.  Has intermittent abdominal cramping, intermittent blurry vision.  No headaches.  Thinks that the worms are in his mouth and his teeth and in his nose because he feels congested.  He denies any chest pain or shortness of breath, no nausea vomiting or diarrhea.  He denies prior history of HIV. States that he is constipated.  States that after he stools, his groin is itching.  Also states that he has a rash to his right proximal thigh, he denies any urinary symptoms or penile discharge.  No fever.  Patient denies any blurry vision or diplopia at this time.  He denies any drug or alcohol use recently.  On independent review, he was seen in emergency department in February, was treated for pinworms.  Patient states after taking the medications, the worms never went away.     Physical Exam   Triage Vital Signs: ED Triage Vitals  Encounter Vitals Group     BP 02/13/24 1614 (!) 189/105     Girls Systolic BP Percentile --      Girls Diastolic BP Percentile --      Boys Systolic BP Percentile --      Boys Diastolic BP Percentile --      Pulse Rate 02/13/24 1614 (!) 104     Resp 02/13/24 1614 18     Temp 02/13/24 1614 97.9 F (36.6 C)     Temp Source 02/13/24 1614 Oral     SpO2 02/13/24 1614 98 %     Weight 02/13/24 1615 170 lb (77.1 kg)     Height 02/13/24 1615 5' 6 (1.676 m)     Head Circumference --      Peak Flow --      Pain Score 02/13/24 1614 0     Pain Loc --      Pain Education  --      Exclude from Growth Chart --     Most recent vital signs: Vitals:   02/13/24 1614  BP: (!) 189/105  Pulse: (!) 104  Resp: 18  Temp: 97.9 F (36.6 C)  SpO2: 98%     General: Awake, no distress.  CV:  Good peripheral perfusion.  Resp:  Normal effort.  No tachypnea or respiratory distress Abd:  No distention.  Soft nontender Other:  Pupils are equal and reactive, extraocular movements are intact, no cranial nerve asymmetry, no focal weakness or numbness, he is steady ambulation.  He is nontoxic-appearing, no nuchal rigidity.  He does have a rash to his right medial proximal thigh that is consistent with tinea cruris   ED Results / Procedures / Treatments   Labs (all labs ordered are listed, but only abnormal results are displayed) Labs Reviewed  COMPREHENSIVE METABOLIC PANEL WITH GFR - Abnormal; Notable for the following components:      Result Value   Potassium 3.4 (*)    Glucose, Bld 114 (*)  All other components within normal limits  CBC WITH DIFFERENTIAL/PLATELET - Abnormal; Notable for the following components:   WBC 11.3 (*)    Neutro Abs 8.9 (*)    All other components within normal limits  OVA + PARASITE EXAM  GASTROINTESTINAL PANEL BY PCR, STOOL (REPLACES STOOL CULTURE)       RADIOLOGY On my independent interpretation, CTH without obvious intracranial hemorrhage   PROCEDURES:  Critical Care performed: No  Procedures   MEDICATIONS ORDERED IN ED: Medications - No data to display   IMPRESSION / MDM / ASSESSMENT AND PLAN / ED COURSE  I reviewed the triage vital signs and the nursing notes.                              Differential diagnosis includes, but is not limited to, possible tapeworm infection, no cranial nerve deficits to suggest CVA, possible psych, possible drug use, fungal infection for the rash.  Will get labs, CT head, will get a GI PCR with ova and parasites.  Patient's presentation is most consistent with acute presentation  with potential threat to life or bodily function.  Independent interpretation of labs and imaging below.  Discussed with patient about imaging and lab results including incidental findings.  He was unable to give a stool sample here so I placed an outpatient order for him to submit stool samples when he is able to.  Also put in a referral for primary care so they can follow-up.  Instructed him to follow-up with the stool testing on his MyChart.  Will prescribe topical ketoconazole for his fungal rash that he can take outpatient.  Considered but no indication for further workup inpatient admission at this time, he safe for outpatient management.  Shared decision making done with patient and he is agreeable with this plan.  Will discharge with strict return precautions.    Clinical Course as of 02/13/24 1832  Sun Feb 13, 2024  1804 CT Head Wo Contrast IMPRESSION: 1. No acute intracranial abnormality. 2. 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   [TT]  1805 Independent review of labs, leukocytosis, eosinophils are not elevated, electrolytes are severely deranged, LFTs are normal. [TT]    Clinical Course User Index [TT] Waymond, Lorelle Cummins, MD     FINAL CLINICAL IMPRESSION(S) / ED DIAGNOSES   Final diagnoses:  Worms in stool  Tinea cruris     Rx / DC Orders   ED Discharge Orders          Ordered    Ambulatory Referral to Primary Care (Establish Care)        02/13/24 1733    ketoconazole (NIZORAL) 2 % cream  Daily        02/13/24 1736    Ova and parasite examination        02/13/24 1806    Gastrointestinal Panel by PCR , Stool        02/13/24 1806             Note:  This document was prepared using Dragon voice recognition software and may include unintentional dictation errors.    Waymond Lorelle Cummins, MD 02/13/24 [phone]

## 2024-02-13 NOTE — ED Triage Notes (Signed)
 Pt. Reports he has a parasite that he contracted from a dog, states he had the dog tested and the animal has tapeworms, he thinks he also has tapeworms, c/o vision problems, rash in groin area, balance issues, denies N/V/D

## 2024-02-13 NOTE — Discharge Instructions (Signed)
 Please make sure to use the antifungal cream for your rash.  I put in a referral for primary care for you for follow-up.  I have also put in a lab order for you for when you are able to give a stool sample to test for parasites.

## 2024-02-22 DIAGNOSIS — R410 Disorientation, unspecified: Secondary | ICD-10-CM | POA: Diagnosis not present

## 2024-02-22 DIAGNOSIS — R2681 Unsteadiness on feet: Secondary | ICD-10-CM | POA: Diagnosis not present

## 2024-02-22 DIAGNOSIS — R471 Dysarthria and anarthria: Secondary | ICD-10-CM | POA: Diagnosis not present

## 2024-02-22 DIAGNOSIS — Z8673 Personal history of transient ischemic attack (TIA), and cerebral infarction without residual deficits: Secondary | ICD-10-CM | POA: Diagnosis not present

## 2024-02-23 DIAGNOSIS — I639 Cerebral infarction, unspecified: Secondary | ICD-10-CM | POA: Diagnosis not present

## 2024-02-23 DIAGNOSIS — I6389 Other cerebral infarction: Secondary | ICD-10-CM | POA: Diagnosis not present

## 2024-02-24 DIAGNOSIS — I340 Nonrheumatic mitral (valve) insufficiency: Secondary | ICD-10-CM | POA: Diagnosis not present

## 2024-02-24 DIAGNOSIS — R471 Dysarthria and anarthria: Secondary | ICD-10-CM | POA: Diagnosis not present

## 2024-02-24 DIAGNOSIS — I639 Cerebral infarction, unspecified: Secondary | ICD-10-CM | POA: Diagnosis not present

## 2024-02-24 DIAGNOSIS — I071 Rheumatic tricuspid insufficiency: Secondary | ICD-10-CM | POA: Diagnosis not present

## 2024-02-24 DIAGNOSIS — G459 Transient cerebral ischemic attack, unspecified: Secondary | ICD-10-CM | POA: Diagnosis not present

## 2024-02-25 DIAGNOSIS — I639 Cerebral infarction, unspecified: Secondary | ICD-10-CM | POA: Diagnosis not present

## 2024-02-26 DIAGNOSIS — I639 Cerebral infarction, unspecified: Secondary | ICD-10-CM | POA: Diagnosis not present

## 2024-02-27 DIAGNOSIS — I639 Cerebral infarction, unspecified: Secondary | ICD-10-CM | POA: Diagnosis not present

## 2024-04-27 DIAGNOSIS — I10 Essential (primary) hypertension: Secondary | ICD-10-CM | POA: Diagnosis not present

## 2024-06-10 ENCOUNTER — Encounter: Payer: Self-pay | Admitting: Emergency Medicine

## 2024-06-10 ENCOUNTER — Emergency Department

## 2024-06-10 ENCOUNTER — Other Ambulatory Visit: Payer: Self-pay

## 2024-06-10 ENCOUNTER — Emergency Department
Admission: EM | Admit: 2024-06-10 | Discharge: 2024-06-10 | Disposition: A | Discharge status: Home / Self Care | Attending: Emergency Medicine | Admitting: Emergency Medicine

## 2024-06-10 DIAGNOSIS — R0989 Other specified symptoms and signs involving the circulatory and respiratory systems: Secondary | ICD-10-CM | POA: Diagnosis not present

## 2024-06-10 DIAGNOSIS — W19XXXA Unspecified fall, initial encounter: Secondary | ICD-10-CM | POA: Insufficient documentation

## 2024-06-10 DIAGNOSIS — J189 Pneumonia, unspecified organism: Secondary | ICD-10-CM | POA: Insufficient documentation

## 2024-06-10 DIAGNOSIS — S2241XA Multiple fractures of ribs, right side, initial encounter for closed fracture: Secondary | ICD-10-CM | POA: Diagnosis not present

## 2024-06-10 DIAGNOSIS — I700 Atherosclerosis of aorta: Secondary | ICD-10-CM | POA: Diagnosis not present

## 2024-06-10 DIAGNOSIS — S2249XA Multiple fractures of ribs, unspecified side, initial encounter for closed fracture: Secondary | ICD-10-CM | POA: Diagnosis not present

## 2024-06-10 DIAGNOSIS — R059 Cough, unspecified: Secondary | ICD-10-CM | POA: Diagnosis not present

## 2024-06-10 DIAGNOSIS — J168 Pneumonia due to other specified infectious organisms: Secondary | ICD-10-CM | POA: Diagnosis not present

## 2024-06-10 LAB — RESP PANEL BY RT-PCR (RSV, FLU A&B, COVID)  RVPGX2
Influenza A by PCR: NEGATIVE
Influenza B by PCR: NEGATIVE
Resp Syncytial Virus by PCR: NEGATIVE
SARS Coronavirus 2 by RT PCR: NEGATIVE

## 2024-06-10 MED ORDER — BENZONATATE 100 MG PO CAPS: 100.0000 mg | ORAL_CAPSULE | Freq: Three times a day (TID) | ORAL | 0 refills | Status: DC | PRN

## 2024-06-10 MED ORDER — DOXYCYCLINE HYCLATE 100 MG PO TABS: 100.0000 mg | ORAL_TABLET | Freq: Two times a day (BID) | ORAL | 0 refills | Status: DC

## 2024-06-10 MED ORDER — BENZONATATE 100 MG PO CAPS: 100.0000 mg | ORAL_CAPSULE | Freq: Three times a day (TID) | ORAL | 0 refills | Status: AC | PRN

## 2024-06-10 MED ORDER — PREDNISONE 20 MG PO TABS: 40.0000 mg | ORAL_TABLET | Freq: Every day | ORAL | 0 refills | Status: DC

## 2024-06-10 MED ORDER — DOXYCYCLINE HYCLATE 100 MG PO TABS: 100.0000 mg | ORAL_TABLET | Freq: Two times a day (BID) | ORAL | 0 refills | Status: AC

## 2024-06-10 MED ORDER — PREDNISONE 20 MG PO TABS: 40.0000 mg | ORAL_TABLET | Freq: Every day | ORAL | 0 refills | Status: AC

## 2024-06-10 MED ORDER — KETOROLAC TROMETHAMINE 30 MG/ML IJ SOLN
30.0000 mg | Freq: Once | INTRAMUSCULAR | Status: AC
  Administered 2024-06-10: 30 mg via INTRAMUSCULAR
  Filled 2024-06-10: qty 1

## 2024-06-10 MED ORDER — AMOXICILLIN-POT CLAVULANATE 875-125 MG PO TABS: 1.0000 | ORAL_TABLET | Freq: Two times a day (BID) | ORAL | 0 refills | Status: DC

## 2024-06-10 MED ORDER — PREDNISONE 20 MG PO TABS
60.0000 mg | ORAL_TABLET | Freq: Once | ORAL | Status: AC
  Administered 2024-06-10: 60 mg via ORAL
  Filled 2024-06-10: qty 3

## 2024-06-10 MED ORDER — IBUPROFEN 600 MG PO TABS: 600.0000 mg | ORAL_TABLET | Freq: Three times a day (TID) | ORAL | 0 refills | Status: DC | PRN

## 2024-06-10 MED ORDER — IPRATROPIUM-ALBUTEROL 0.5-2.5 (3) MG/3ML IN SOLN
3.0000 mL | Freq: Once | RESPIRATORY_TRACT | Status: AC
  Administered 2024-06-10: 3 mL via RESPIRATORY_TRACT
  Filled 2024-06-10: qty 3

## 2024-06-10 MED ORDER — ALBUTEROL SULFATE HFA 108 (90 BASE) MCG/ACT IN AERS: 2.0000 | INHALATION_SPRAY | Freq: Four times a day (QID) | RESPIRATORY_TRACT | 2 refills | Status: DC | PRN

## 2024-06-10 MED ORDER — ALBUTEROL SULFATE HFA 108 (90 BASE) MCG/ACT IN AERS: 2.0000 | INHALATION_SPRAY | Freq: Four times a day (QID) | RESPIRATORY_TRACT | 2 refills | Status: AC | PRN

## 2024-06-10 MED ORDER — IBUPROFEN 600 MG PO TABS: 600.0000 mg | ORAL_TABLET | Freq: Three times a day (TID) | ORAL | 0 refills | Status: AC | PRN

## 2024-06-10 MED ORDER — AMOXICILLIN-POT CLAVULANATE 875-125 MG PO TABS: 1.0000 | ORAL_TABLET | Freq: Two times a day (BID) | ORAL | 0 refills | Status: AC

## 2024-06-10 MED ORDER — IBUPROFEN 600 MG PO TABS: 600.0000 mg | ORAL_TABLET | Freq: Four times a day (QID) | ORAL | 0 refills | Status: DC | PRN

## 2024-06-10 NOTE — ED Triage Notes (Signed)
 Pt reports he fell at work a few weeks ago. Pt endorses right shoulder and rib pain since the fall. Pt also reports cough and congestion for 4 days.

## 2024-06-10 NOTE — Discharge Instructions (Addendum)
 We are starting you on 2 antibiotics because I do suspect that you have a pneumonia that may be being missed on the x-ray given your cough.  Have also given you some medications to help with the cough and some ibuprofen  and steroids that will help with inflammation, pain.  Take these medications with food as they can sometimes cause upset stomach.  Return to the ER if you develop worsening discomfort, shortness of breath or any other concerns.  IMPRESSION: 1. Right 6th and 7th rib fractures. 2. No signs of right shoulder fracture or subluxation.

## 2024-06-10 NOTE — ED Provider Notes (Addendum)
 [facility] Provider Note    Event Date/Time   First MD Initiated Contact with Patient 06/10/24 1410     (approximate)   History   Fall   HPI  Barry Wheeler is a 53 y.o. male with use disorder who is currently on Suboxone who comes in with a fall.  Patient reports that about 1 week ago he had a fall where he injured his right shoulder and right ribs.  He did not come into the hospital denies hitting his head reports it was mechanical fall.  He reports that over the past 4 days he has had increasing cough and congestion and now every time he coughs and is uncomfortable on the right side secondary to the prior injury.  He denies any shortness of breath, fevers. I reviewed patient's BMP from 7/13 where patient had normal creatinine and normal glucose.  Physical Exam   Triage Vital Signs: ED Triage Vitals [06/10/24 1300]  Encounter Vitals Group     BP 134/73     Girls Systolic BP Percentile      Girls Diastolic BP Percentile      Boys Systolic BP Percentile      Boys Diastolic BP Percentile      Pulse Rate 100     Resp 18     Temp 98.3 F (36.8 C)     Temp Source Oral     SpO2 100 %     Weight 170 lb (77.1 kg)     Height 5' 6 (1.676 m)     Head Circumference      Peak Flow      Pain Score 10     Pain Loc      Pain Education      Exclude from Growth Chart     Most recent vital signs: Vitals:   06/10/24 1300  BP: 134/73  Pulse: 100  Resp: 18  Temp: 98.3 F (36.8 C)  SpO2: 100%     General: Awake, no distress.  CV:  Good peripheral perfusion.  Resp:  Normal effort.  Wheezing noted bilaterally Abd:  No distention.  Soft and nontender Other:  Tenderness along the right chest wall.  No bruising   ED Results / Procedures / Treatments   Labs (all labs ordered are listed, but only abnormal results are displayed) Labs Reviewed  RESP PANEL BY RT-PCR (RSV, FLU A&B, COVID)  [id number]     EKG  My interpretation of EKG:  Pt declined  EKG   RADIOLOGY I have reviewed the xray personally and interpreted positive rib fracture   PROCEDURES:  Critical Care performed: No  Procedures   MEDICATIONS ORDERED IN ED: Medications  ipratropium-albuterol  (DUONEB) 0.5-2.5 (3) MG/3ML nebulizer solution 3 mL (has no administration in time range)  predniSONE  (DELTASONE ) tablet 60 mg (has no administration in time range)  ketorolac  (TORADOL ) 30 MG/ML injection 30 mg (has no administration in time range)     IMPRESSION / MDM / ASSESSMENT AND PLAN / ED COURSE  I reviewed the triage vital signs and the nursing notes.   Patient's presentation is most consistent with acute presentation with potential threat to life or bodily function.    Patient comes in for a fall 2 weeks ago suspect patient has rib fractures given his reproducible pain but no bruising denies any blood thinners.  Will get x-ray to evaluate for any pneumonia, pneumothorax, rib fractures.  COVID test is negative.  Patient's pain is reproducible doubt ACS  discussed doing an EKG but patient declines and he really like to go home.  Given breathing treatment to patient he reported feeling improved.  Patient is standing at the door asking for his discharge paperwork.  Given x-rays show rib fracture without complication he satting 100% I think this is reasonable to go home.  We discussed pain control with Tylenol , ibuprofen , steroids to help with any inflammation of the lungs and antibiotics to help with any occult pneumonia given high risk given recent rib fractures.  We discussed incentive spirometer to help with breathing.      FINAL CLINICAL IMPRESSION(S) / ED DIAGNOSES   Final diagnoses:  Closed fracture of multiple ribs of right side, initial encounter  Pneumonia due to infectious organism, unspecified laterality, unspecified part of lung     Rx / DC Orders   ED Discharge Orders          Ordered    amoxicillin-clavulanate (AUGMENTIN) 875-125 MG tablet  2  times daily        06/10/24 1551    doxycycline  (VIBRA -TABS) 100 MG tablet  2 times daily        06/10/24 1551    ibuprofen  (ADVIL ) 600 MG tablet  Every 6 hours PRN        06/10/24 1551    predniSONE  (DELTASONE ) 20 MG tablet  Daily with breakfast        06/10/24 1551             Note:  This document was prepared using Dragon voice recognition software and may include unintentional dictation errors.   Ernest Ronal BRAVO, MD 06/10/24 1553    Ernest Ronal BRAVO, MD 06/10/24 [phone]

## 2024-07-10 DIAGNOSIS — I10 Essential (primary) hypertension: Secondary | ICD-10-CM | POA: Diagnosis not present

## 2024-07-10 DIAGNOSIS — Z125 Encounter for screening for malignant neoplasm of prostate: Secondary | ICD-10-CM | POA: Diagnosis not present
